# Patient Record
Sex: Male | Born: 1985 | Race: White | Hispanic: No | Marital: Single | Smoking: Never smoker
Health system: Southern US, Community
[De-identification: ages and names within clinical notes are randomized; demographics above are authoritative.]

## PROBLEM LIST (undated history)

## (undated) DIAGNOSIS — G40B09 Juvenile myoclonic epilepsy, not intractable, without status epilepticus: Secondary | ICD-10-CM

## (undated) DIAGNOSIS — R569 Unspecified convulsions: Secondary | ICD-10-CM

## (undated) HISTORY — PX: OTHER SURGICAL HISTORY: SHX169

## (undated) HISTORY — PX: ELBOW SURGERY: SHX618

## (undated) HISTORY — PX: ABDOMINAL SURGERY: SHX537

## (undated) HISTORY — PX: FRACTURE SURGERY: SHX138

## (undated) HISTORY — PX: SHOULDER SURGERY: SHX246

---
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
## 2006-05-04 ENCOUNTER — Emergency Department (HOSPITAL_COMMUNITY): Payer: Self-pay | Admitting: EXTERNAL

## 2006-07-14 ENCOUNTER — Ambulatory Visit (INDEPENDENT_AMBULATORY_CARE_PROVIDER_SITE_OTHER): Payer: Self-pay | Admitting: PEDIATRIC MEDICINE

## 2006-11-07 ENCOUNTER — Encounter (FREE_STANDING_LABORATORY_FACILITY)
Admit: 2006-11-07 | Discharge: 2006-11-07 | Disposition: A | Discharge status: Home / Self Care | Payer: Self-pay | Attending: Psychiatry | Admitting: Psychiatry

## 2006-11-07 ENCOUNTER — Ambulatory Visit (INDEPENDENT_AMBULATORY_CARE_PROVIDER_SITE_OTHER): Payer: PRIVATE HEALTH INSURANCE | Admitting: Psychiatry

## 2006-11-07 DIAGNOSIS — F39 Unspecified mood [affective] disorder: Secondary | ICD-10-CM | POA: Diagnosis present

## 2006-12-05 ENCOUNTER — Encounter (INDEPENDENT_AMBULATORY_CARE_PROVIDER_SITE_OTHER): Payer: PRIVATE HEALTH INSURANCE

## 2006-12-08 ENCOUNTER — Encounter (HOSPITAL_COMMUNITY): Payer: Self-pay | Admitting: Psychiatry

## 2008-04-02 ENCOUNTER — Encounter (EMERGENCY_DEPARTMENT_HOSPITAL): Payer: Medicaid Other | Admitting: Trauma Surgery

## 2008-04-02 ENCOUNTER — Emergency Department (EMERGENCY_DEPARTMENT_HOSPITAL): Payer: Medicaid Other

## 2008-04-02 ENCOUNTER — Encounter (HOSPITAL_COMMUNITY): Payer: Self-pay

## 2008-04-02 ENCOUNTER — Observation Stay
Admission: EM | Admit: 2008-04-02 | Discharge: 2008-04-03 | Disposition: A | Discharge status: Home / Self Care | Payer: Medicaid Other | Source: Emergency Department | Attending: Trauma Surgery | Admitting: Trauma Surgery

## 2008-04-02 DIAGNOSIS — Z043 Encounter for examination and observation following other accident: Principal | ICD-10-CM | POA: Insufficient documentation

## 2008-04-02 DIAGNOSIS — G809 Cerebral palsy, unspecified: Secondary | ICD-10-CM | POA: Insufficient documentation

## 2008-04-02 DIAGNOSIS — G40909 Epilepsy, unspecified, not intractable, without status epilepticus: Secondary | ICD-10-CM | POA: Insufficient documentation

## 2008-04-02 DIAGNOSIS — R404 Transient alteration of awareness: Secondary | ICD-10-CM | POA: Insufficient documentation

## 2008-04-02 DIAGNOSIS — IMO0002 Reserved for concepts with insufficient information to code with codable children: Secondary | ICD-10-CM | POA: Insufficient documentation

## 2008-04-02 LAB — PTT (PARTIAL THROMBOPLASTIN TIME): APTT: 24.4 s (ref 22.5–32.0)

## 2008-04-02 LAB — PT/INR
INR: 1 (ref 0.8–1.2)
PROTHROMBIN TIME: 10.3 s (ref 9.1–11.2)

## 2008-04-02 LAB — TYPE AND SCREEN
ABO/RH(D): A POS
ANTIBODY SCREEN: NEGATIVE

## 2008-04-02 LAB — CBC/DIFF
BASOPHILS: 0 % (ref 0–1)
BASOS ABS: 0.013 THOU/uL (ref 0.0–0.2)
EOS ABS: 0.028 THOU/uL — ABNORMAL LOW (ref 0.1–0.3)
EOSINOPHIL: 0 % — ABNORMAL LOW (ref 1–6)
HCT: 46.6 % (ref 39.8–50.2)
HGB: 15.3 g/dL (ref 13.1–17.3)
LYMPHOCYTES: 10 % — ABNORMAL LOW (ref 30–40)
LYMPHS ABS: 1.01 THOU/uL — ABNORMAL LOW (ref 1.2–5.2)
MCH: 29.8 pg (ref 27.4–33.0)
MCHC: 32.8 g/dL (ref 31.6–35.5)
MCV: 90.7 fL (ref 82.0–99.0)
MONOCYTES: 7 % (ref 3–7)
MONOS ABS: 0.767 THOU/uL — ABNORMAL HIGH (ref 0.2–0.6)
MPV: 8.2 FL (ref 7.4–10.4)
PLATELET COUNT: 273 THO/UL (ref 140–450)
PMN ABS: 8.78 10*3/uL — ABNORMAL HIGH (ref 1.8–8.0)
PMN'S: 83 % — ABNORMAL HIGH (ref 52–62)
RBC: 5.13 MIL/uL (ref 4.46–5.70)
RDW: 11.7 % (ref 10.2–14.0)
WBC: 10.6 THOU/UL (ref 3.5–11.0)

## 2008-04-02 LAB — BASIC METABOLIC PANEL: BUN/CREAT RATIO: 13 (ref 6–22)

## 2008-04-02 MED ORDER — MORPHINE 2 MG/ML INJECTION SYRINGE
1.00 mg | INJECTION | INTRAMUSCULAR | Status: DC | PRN
Start: 2008-04-02 — End: 2008-04-02
  Administered 2008-04-02: 2 mg via INTRAVENOUS
  Filled 2008-04-02: qty 1

## 2008-04-02 MED ORDER — SENNOSIDES 8.6 MG-DOCUSATE SODIUM 50 MG TABLET
1.00 | ORAL_TABLET | Freq: Two times a day (BID) | ORAL | Status: DC
Start: 2008-04-02 — End: 2008-04-03
  Administered 2008-04-02 – 2008-04-03 (×2): 1 via ORAL
  Filled 2008-04-02 (×3): qty 1

## 2008-04-02 MED ORDER — ACETAMINOPHEN 325 MG TABLET
650.00 mg | ORAL_TABLET | Freq: Four times a day (QID) | ORAL | Status: DC | PRN
Start: 2008-04-02 — End: 2008-04-03

## 2008-04-02 MED ORDER — SODIUM CHLORIDE 0.9 % INTRAVENOUS SOLUTION
INTRAVENOUS | Status: DC
Start: 2008-04-02 — End: 2008-04-02

## 2008-04-02 MED ORDER — DOCUSATE SODIUM 100 MG CAPSULE
100.00 mg | ORAL_CAPSULE | Freq: Two times a day (BID) | ORAL | Status: DC
Start: 2008-04-02 — End: 2008-04-03
  Administered 2008-04-02 – 2008-04-03 (×2): 100 mg via ORAL
  Filled 2008-04-02 (×3): qty 1

## 2008-04-02 MED ORDER — MORPHINE 4 MG/ML INJECTION SYRINGE
4.0000 mg | INJECTION | Freq: Once | INTRAMUSCULAR | Status: DC
Start: 2008-04-02 — End: 2008-04-02
  Administered 2008-04-02: 0 mg via INTRAVENOUS

## 2008-04-02 MED ORDER — ONDANSETRON HCL (PF) 4 MG/2 ML INJECTION SOLUTION
4.00 mg | Freq: Four times a day (QID) | INTRAMUSCULAR | Status: DC | PRN
Start: 2008-04-02 — End: 2008-04-03

## 2008-04-02 MED ORDER — OXYCODONE-ACETAMINOPHEN 5 MG-325 MG TABLET
1.0000 | ORAL_TABLET | ORAL | Status: DC | PRN
Start: 2008-04-02 — End: 2008-04-03
  Administered 2008-04-03 (×2): 1 via ORAL
  Filled 2008-04-02 (×2): qty 1
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
## 2008-04-02 NOTE — ED Nurses Note (Signed)
Nursing student Corrie Dandy, cleaned right [facility] with chliraopep and inserted 18 gauge angio cath with positive blood return and labs were collected.

## 2008-04-02 NOTE — ED Nurses Note (Signed)
 Dr. Juanita of Trauma in evaluating pt at this time.
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
## 2008-04-02 NOTE — ED Nurses Note (Signed)
Pt resting quietly in room no distress family at bedsdie.  Awaiting disposition
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
## 2008-04-02 NOTE — ED Nurses Note (Signed)
Report called to Darel Hong on 7 East, pt is ready for transport to the floor on a monitor with a nurse.
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
## 2008-04-03 ENCOUNTER — Encounter (HOSPITAL_COMMUNITY): Payer: Self-pay

## 2008-04-03 LAB — CBC/DIFF
BASOPHILS: 0 % (ref 0–1)
BASOS ABS: 0.008 THOU/uL (ref 0.0–0.2)
EOS ABS: 0.094 THOU/uL — ABNORMAL LOW (ref 0.1–0.3)
EOSINOPHIL: 1 % (ref 1–6)
HCT: 44.4 % (ref 39.8–50.2)
HGB: 14.4 g/dL (ref 13.1–17.3)
LYMPHOCYTES: 28 % — ABNORMAL LOW (ref 30–40)
LYMPHS ABS: 2.14 THOU/uL (ref 1.2–5.2)
MCH: 29.9 pg (ref 27.4–33.0)
MCHC: 32.5 g/dL (ref 31.6–35.5)
MCV: 92 fL (ref 82.0–99.0)
MONOCYTES: 11 % — ABNORMAL HIGH (ref 3–7)
MONOS ABS: 0.877 THOU/uL — ABNORMAL HIGH (ref 0.2–0.6)
MPV: 8.8 FL (ref 7.4–10.4)
PLATELET COUNT: 236 THO/UL (ref 140–450)
PMN ABS: 4.6 THOU/uL (ref 1.8–8.0)
PMN'S: 60 % (ref 52–62)
RBC: 4.83 MIL/uL (ref 4.46–5.70)
RDW: 11.7 % (ref 10.2–14.0)
WBC: 7.7 THOU/UL (ref 3.5–11.0)

## 2008-04-03 LAB — CREATININE
CREATININE: 0.99 mg/dL (ref 0.62–1.27)
ESTIMATED GLOMERULAR FILTRATION RATE: >59 mL/min/{1.73_m2} (ref >59)

## 2008-04-03 LAB — BUN
BUN/CREAT RATIO: 11 (ref 6–22)
BUN: 11 mg/dL (ref 8–26)

## 2008-04-03 LAB — H & H
HCT: 44.2 % (ref 39.8–50.2)
HGB: 14.5 g/dL (ref 13.1–17.3)

## 2008-04-03 LAB — ELECTROLYTES
ANION GAP: 7 mmol/L (ref 5–16)
CARBON DIOXIDE: 29 mmol/L (ref 23–33)
SODIUM: 142 mmol/L (ref 136–145)

## 2008-04-03 MED ORDER — OXYCODONE-ACETAMINOPHEN 5 MG-325 MG TABLET
1.00 | ORAL_TABLET | ORAL | Status: DC | PRN
Start: 2008-04-03 — End: 2020-05-31

## 2008-04-03 MED ORDER — DOCUSATE SODIUM 100 MG CAPSULE
100.00 mg | ORAL_CAPSULE | Freq: Two times a day (BID) | ORAL | Status: DC
Start: 2008-04-03 — End: 2020-05-31

## 2008-04-03 NOTE — Nurses Notes (Signed)
Complete assessment as charted per flowsheet.  Neuro. AAOX3.  Resps. Even and non-laboured.  Denies shortness of breath.  Sats. WNL.  VSS.  Pain controlled with PRN analgesics.  Immobilizer intact to LLE.  Dsicharge instructions given along with return appointments.  Father at bedside for transport to home.

## 2008-04-03 NOTE — Nurses Notes (Signed)
C/O pain gave 1 percocet per pain order, will monitor.
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
## 2008-04-17 ENCOUNTER — Ambulatory Visit (INDEPENDENT_AMBULATORY_CARE_PROVIDER_SITE_OTHER): Payer: Medicaid Other

## 2008-04-24 ENCOUNTER — Ambulatory Visit (INDEPENDENT_AMBULATORY_CARE_PROVIDER_SITE_OTHER): Payer: Medicaid Other | Admitting: ORTHOPEDIC, SPORTS MEDICINE
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
## 2010-07-26 ENCOUNTER — Emergency Department (HOSPITAL_BASED_OUTPATIENT_CLINIC_OR_DEPARTMENT_OTHER)
Admission: EM | Admit: 2010-07-26 | Discharge: 2010-07-26 | Payer: Self-pay | Source: Home / Self Care | Admitting: Emergency Medicine

## 2010-09-01 ENCOUNTER — Emergency Department (INDEPENDENT_AMBULATORY_CARE_PROVIDER_SITE_OTHER): Payer: Self-pay

## 2010-09-01 ENCOUNTER — Emergency Department (HOSPITAL_BASED_OUTPATIENT_CLINIC_OR_DEPARTMENT_OTHER)
Admission: EM | Admit: 2010-09-01 | Discharge: 2010-09-01 | Disposition: A | Discharge status: Home / Self Care | Payer: Self-pay | Attending: Emergency Medicine | Admitting: Emergency Medicine

## 2010-09-01 DIAGNOSIS — R071 Chest pain on breathing: Secondary | ICD-10-CM | POA: Insufficient documentation

## 2010-09-01 DIAGNOSIS — G40909 Epilepsy, unspecified, not intractable, without status epilepticus: Secondary | ICD-10-CM | POA: Insufficient documentation

## 2010-09-01 DIAGNOSIS — R072 Precordial pain: Secondary | ICD-10-CM

## 2010-09-22 ENCOUNTER — Inpatient Hospital Stay (HOSPITAL_COMMUNITY)
Admission: EM | Admit: 2010-09-22 | Discharge: 2010-09-24 | DRG: 206 | Disposition: A | Discharge status: Home / Self Care | Payer: No Typology Code available for payment source | Attending: Surgery | Admitting: Surgery

## 2010-09-22 ENCOUNTER — Emergency Department (HOSPITAL_COMMUNITY): Payer: Self-pay

## 2010-09-22 ENCOUNTER — Emergency Department (HOSPITAL_COMMUNITY): Payer: No Typology Code available for payment source

## 2010-09-22 DIAGNOSIS — E860 Dehydration: Secondary | ICD-10-CM | POA: Diagnosis present

## 2010-09-22 DIAGNOSIS — G40909 Epilepsy, unspecified, not intractable, without status epilepticus: Secondary | ICD-10-CM | POA: Diagnosis present

## 2010-09-22 DIAGNOSIS — S27329A Contusion of lung, unspecified, initial encounter: Principal | ICD-10-CM | POA: Diagnosis present

## 2010-09-22 LAB — CBC
HCT: 47.6 % (ref 39.0–52.0)
Hemoglobin: 16.9 g/dL (ref 13.0–17.0)
MCH: 31.8 pg (ref 26.0–34.0)
MCHC: 35.5 g/dL (ref 30.0–36.0)
MCV: 89.5 fL (ref 78.0–100.0)
RDW: 12.6 % (ref 11.5–15.5)

## 2010-09-22 LAB — COMPREHENSIVE METABOLIC PANEL
Albumin: 4.2 g/dL (ref 3.5–5.2)
Alkaline Phosphatase: 44 U/L (ref 39–117)
BUN: 16 mg/dL (ref 6–23)
CO2: 28 mEq/L (ref 19–32)
Chloride: 106 mEq/L (ref 96–112)
Creatinine, Ser: 1.19 mg/dL (ref 0.4–1.5)
GFR calc non Af Amer: >60 mL/min (ref >60)
Potassium: 4.2 mEq/L (ref 3.5–5.1)
Total Bilirubin: 0.9 mg/dL (ref 0.3–1.2)

## 2010-09-22 LAB — POCT I-STAT, CHEM 8
BUN: 21 mg/dL (ref 6–23)
Calcium, Ion: 1.21 mmol/L (ref 1.12–1.32)
Chloride: 104 mEq/L (ref 96–112)
Creatinine, Ser: 1.2 mg/dL (ref 0.4–1.5)
Glucose, Bld: 149 mg/dL — ABNORMAL HIGH (ref 70–99)
Potassium: 4 mEq/L (ref 3.5–5.1)

## 2010-09-22 LAB — PROTIME-INR: INR: 0.98 (ref 0.00–1.49)

## 2010-09-22 MED ORDER — IOHEXOL 300 MG/ML  SOLN: 100.0000 mL | Freq: Once | INTRAMUSCULAR | Status: DC | PRN

## 2010-09-22 MED ORDER — IOHEXOL 300 MG/ML  SOLN
100.0000 mL | Freq: Once | INTRAMUSCULAR | Status: AC | PRN
  Administered 2010-09-22: 100 mL via INTRAVENOUS

## 2010-09-23 ENCOUNTER — Inpatient Hospital Stay (HOSPITAL_COMMUNITY): Payer: Self-pay
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
## 2010-10-05 ENCOUNTER — Emergency Department (INDEPENDENT_AMBULATORY_CARE_PROVIDER_SITE_OTHER): Payer: No Typology Code available for payment source

## 2010-10-05 ENCOUNTER — Emergency Department (HOSPITAL_BASED_OUTPATIENT_CLINIC_OR_DEPARTMENT_OTHER)
Admission: EM | Admit: 2010-10-05 | Discharge: 2010-10-05 | Disposition: A | Discharge status: Home / Self Care | Payer: No Typology Code available for payment source | Attending: Emergency Medicine | Admitting: Emergency Medicine

## 2010-10-05 DIAGNOSIS — R51 Headache: Secondary | ICD-10-CM | POA: Insufficient documentation

## 2010-10-05 DIAGNOSIS — F29 Unspecified psychosis not due to a substance or known physiological condition: Secondary | ICD-10-CM | POA: Insufficient documentation

## 2010-10-05 DIAGNOSIS — R413 Other amnesia: Secondary | ICD-10-CM

## 2010-10-05 LAB — COMPREHENSIVE METABOLIC PANEL
ALT: 21 U/L (ref 0–53)
AST: 35 U/L (ref 0–37)
Albumin: 4.2 g/dL (ref 3.5–5.2)
Calcium: 9.6 mg/dL (ref 8.4–10.5)
Creatinine, Ser: 1.3 mg/dL (ref 0.4–1.5)
GFR calc Af Amer: >60 mL/min (ref >60)
Sodium: 145 mEq/L (ref 135–145)
Total Protein: 7.2 g/dL (ref 6.0–8.3)

## 2010-10-05 LAB — URINALYSIS, ROUTINE W REFLEX MICROSCOPIC
Hgb urine dipstick: NEGATIVE
Ketones, ur: 15 mg/dL — AB
Protein, ur: 30 mg/dL — AB
Specific Gravity, Urine: 1.027 (ref 1.005–1.030)
Urobilinogen, UA: 1 mg/dL (ref 0.0–1.0)

## 2010-10-05 LAB — CBC
MCH: 30.9 pg (ref 26.0–34.0)
MCHC: 34.6 g/dL (ref 30.0–36.0)
Platelets: 222 10*3/uL (ref 150–400)
RBC: 4.82 MIL/uL (ref 4.22–5.81)

## 2010-10-05 LAB — DIFFERENTIAL
Basophils Absolute: 0 10*3/uL (ref 0.0–0.1)
Basophils Relative: 0 % (ref 0–1)
Eosinophils Absolute: 0 10*3/uL (ref 0.0–0.7)
Monocytes Absolute: 0.7 10*3/uL (ref 0.1–1.0)
Monocytes Relative: 11 % (ref 3–12)
Neutro Abs: 4.1 10*3/uL (ref 1.7–7.7)
Neutrophils Relative %: 64 % (ref 43–77)

## 2010-10-05 LAB — POCT TOXICOLOGY PANEL

## 2010-10-05 LAB — URINE MICROSCOPIC-ADD ON

## 2010-10-25 ENCOUNTER — Inpatient Hospital Stay (INDEPENDENT_AMBULATORY_CARE_PROVIDER_SITE_OTHER)
Admission: RE | Admit: 2010-10-25 | Discharge: 2010-10-25 | Disposition: A | Discharge status: Home / Self Care | Payer: Self-pay | Source: Ambulatory Visit | Attending: Family Medicine | Admitting: Family Medicine

## 2010-10-25 DIAGNOSIS — R569 Unspecified convulsions: Secondary | ICD-10-CM

## 2010-10-29 ENCOUNTER — Emergency Department (HOSPITAL_COMMUNITY): Payer: No Typology Code available for payment source

## 2010-10-29 ENCOUNTER — Emergency Department (HOSPITAL_COMMUNITY)
Admission: EM | Admit: 2010-10-29 | Discharge: 2010-10-29 | Disposition: A | Discharge status: Home / Self Care | Payer: No Typology Code available for payment source | Attending: Emergency Medicine | Admitting: Emergency Medicine

## 2010-10-29 DIAGNOSIS — S025XXA Fracture of tooth (traumatic), initial encounter for closed fracture: Secondary | ICD-10-CM | POA: Insufficient documentation

## 2010-10-29 DIAGNOSIS — S0180XA Unspecified open wound of other part of head, initial encounter: Secondary | ICD-10-CM | POA: Insufficient documentation

## 2010-10-29 DIAGNOSIS — Y9289 Other specified places as the place of occurrence of the external cause: Secondary | ICD-10-CM | POA: Insufficient documentation

## 2010-10-29 DIAGNOSIS — F29 Unspecified psychosis not due to a substance or known physiological condition: Secondary | ICD-10-CM | POA: Insufficient documentation

## 2010-10-29 DIAGNOSIS — R296 Repeated falls: Secondary | ICD-10-CM | POA: Insufficient documentation

## 2010-10-29 DIAGNOSIS — R6884 Jaw pain: Secondary | ICD-10-CM | POA: Insufficient documentation

## 2010-10-29 DIAGNOSIS — G40909 Epilepsy, unspecified, not intractable, without status epilepticus: Secondary | ICD-10-CM | POA: Insufficient documentation

## 2010-10-29 DIAGNOSIS — R51 Headache: Secondary | ICD-10-CM | POA: Insufficient documentation

## 2010-10-29 DIAGNOSIS — M542 Cervicalgia: Secondary | ICD-10-CM | POA: Insufficient documentation

## 2010-10-29 LAB — POCT I-STAT, CHEM 8
Calcium, Ion: 1.18 mmol/L (ref 1.12–1.32)
Glucose, Bld: 121 mg/dL — ABNORMAL HIGH (ref 70–99)
HCT: 49 % (ref 39.0–52.0)
Hemoglobin: 16.7 g/dL (ref 13.0–17.0)

## 2011-04-27 ENCOUNTER — Emergency Department (INDEPENDENT_AMBULATORY_CARE_PROVIDER_SITE_OTHER): Payer: No Typology Code available for payment source

## 2011-04-27 ENCOUNTER — Encounter: Payer: Self-pay | Admitting: Family Medicine

## 2011-04-27 ENCOUNTER — Emergency Department (HOSPITAL_BASED_OUTPATIENT_CLINIC_OR_DEPARTMENT_OTHER)
Admission: EM | Admit: 2011-04-27 | Discharge: 2011-04-27 | Disposition: A | Discharge status: Home / Self Care | Payer: Self-pay | Attending: Emergency Medicine | Admitting: Emergency Medicine

## 2011-04-27 DIAGNOSIS — S0083XA Contusion of other part of head, initial encounter: Secondary | ICD-10-CM | POA: Insufficient documentation

## 2011-04-27 DIAGNOSIS — S0003XA Contusion of scalp, initial encounter: Secondary | ICD-10-CM | POA: Insufficient documentation

## 2011-04-27 DIAGNOSIS — Y92009 Unspecified place in unspecified non-institutional (private) residence as the place of occurrence of the external cause: Secondary | ICD-10-CM | POA: Insufficient documentation

## 2011-04-27 DIAGNOSIS — R51 Headache: Secondary | ICD-10-CM

## 2011-04-27 DIAGNOSIS — R5383 Other fatigue: Secondary | ICD-10-CM

## 2011-04-27 DIAGNOSIS — G40909 Epilepsy, unspecified, not intractable, without status epilepticus: Secondary | ICD-10-CM | POA: Insufficient documentation

## 2011-04-27 DIAGNOSIS — W19XXXA Unspecified fall, initial encounter: Secondary | ICD-10-CM | POA: Insufficient documentation

## 2011-04-27 DIAGNOSIS — IMO0002 Reserved for concepts with insufficient information to code with codable children: Secondary | ICD-10-CM

## 2011-04-27 DIAGNOSIS — S0100XA Unspecified open wound of scalp, initial encounter: Secondary | ICD-10-CM

## 2011-04-27 HISTORY — DX: Unspecified convulsions: R56.9

## 2011-04-27 MED ORDER — LORAZEPAM 2 MG/ML IJ SOLN
INTRAMUSCULAR | Status: AC
  Administered 2011-04-27: 1 mg via INTRAVENOUS
  Filled 2011-04-27: qty 1

## 2011-04-27 MED ORDER — LORAZEPAM 2 MG/ML IJ SOLN
1.0000 mg | Freq: Once | INTRAMUSCULAR | Status: AC
  Administered 2011-04-27: 1 mg via INTRAVENOUS

## 2011-04-27 NOTE — ED Provider Notes (Signed)
History     CSN: [id number] Arrival date & time: 04/27/2011  9:04 AM   First MD Initiated Contact with Patient 04/27/11 [phone]      Chief Complaint  Patient presents with  . Seizures    (Consider location/radiation/quality/duration/timing/severity/associated sxs/prior treatment) Patient is a 25 y.o. male presenting with seizures.  Seizures  This is a chronic problem. The current episode started less than 1 hour ago. The problem has been resolved. There was 1 seizure. The most recent episode lasted less than 30 seconds. Associated symptoms include sleepiness, headaches and vomiting. Pertinent negatives include no visual disturbance. Characteristics include rhythmic jerking and loss of consciousness. Characteristics do not include bowel incontinence, bladder incontinence or bit tongue. The episode was witnessed. There was no sensation of an aura present. The seizures did not continue in the ED. The seizure(s) had no focality. Possible causes do not include med or dosage change, sleep deprivation, missed seizure meds, recent illness or change in alcohol use. There has been no fever. There were no medications administered prior to arrival.   Pt hit his head on a tile floor when fell.  No neck pain and only mild head pain  Past Medical History  Diagnosis Date  . Seizure     No past surgical history on file.  No family history on file.  History  Substance Use Topics  . Smoking status: Not on file  . Smokeless tobacco: Not on file  . Alcohol Use:       Review of Systems  HENT: Negative for nosebleeds, facial swelling, neck pain and neck stiffness.   Eyes: Negative.  Negative for photophobia and visual disturbance.  Gastrointestinal: Positive for vomiting. Negative for bowel incontinence.  Genitourinary: Negative for bladder incontinence.  Neurological: Positive for seizures, loss of consciousness and headaches.  All other systems reviewed and are negative.    Allergies    Review of patient's allergies indicates no known allergies.  Home Medications   Current Outpatient Rx  Name Route Sig Dispense Refill  . LAMOTRIGINE 100 MG PO TABS Oral Take 100 mg by mouth daily.        BP 132/111  Pulse 95  Resp 16  SpO2 95%  Physical Exam  Nursing note and vitals reviewed. Constitutional: He is oriented to person, place, and time. He appears well-developed and well-nourished. No distress.  HENT:  Head: Normocephalic.    Right Ear: External ear normal.  Left Ear: External ear normal.  Nose: Nose normal.  Mouth/Throat: Oropharynx is clear and moist.  Eyes: Conjunctivae and EOM are normal. Pupils are equal, round, and reactive to light.  Neck: Normal range of motion. Neck supple.  Cardiovascular: Normal rate, regular rhythm, normal heart sounds and intact distal pulses.   Pulmonary/Chest: Effort normal and breath sounds normal. No respiratory distress.  Abdominal: Soft. Bowel sounds are normal.  Musculoskeletal: Normal range of motion. He exhibits no tenderness.  Neurological: He is alert and oriented to person, place, and time.  Skin: Skin is warm and dry.  Psychiatric: He has a normal mood and affect. His behavior is normal. Judgment and thought content normal.    ED Course  Procedures (including critical care time)  Labs Reviewed - No data to display No results found.   No diagnosis found.    MDM  Pt with hx of sz dx and on lamictal without missing any doses and last sz was 3 months ago.  Pt hit his head but awake and alert here.  C-spine cleared  and removed from board.  No other injuries other than head.  Will get CT.  Neuro intact and pt thinks may be stress related as nothing else has changed and has taken all his lamictal.  Ativan given to prevent sx. CT wnl.  Abrasion on the scalp does not need further repair.  To f/u with neurology if sx become more frequent.      Gwyneth Sprout, MD 04/27/11 [phone]

## 2011-04-27 NOTE — ED Notes (Signed)
Pt attempting to call for transportation.

## 2011-04-27 NOTE — ED Notes (Addendum)
Pt vomiting upon arrival. Suction used to clear vomitus. MD at bedside and medication ordered. Pt sts last seizure in March. Pt sts "sometimes I don't remember to take my medicine".
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
## 2011-04-27 NOTE — ED Notes (Signed)
Pt requesting tylenol for headache

## 2011-04-27 NOTE — ED Notes (Signed)
Pt returned from CT. Alert and oriented, smiling. nad noted.

## 2011-09-21 ENCOUNTER — Encounter (HOSPITAL_BASED_OUTPATIENT_CLINIC_OR_DEPARTMENT_OTHER): Payer: Self-pay

## 2011-09-21 ENCOUNTER — Emergency Department (HOSPITAL_BASED_OUTPATIENT_CLINIC_OR_DEPARTMENT_OTHER)
Admission: EM | Admit: 2011-09-21 | Discharge: 2011-09-21 | Disposition: A | Discharge status: Home / Self Care | Payer: PRIVATE HEALTH INSURANCE | Attending: Emergency Medicine | Admitting: Emergency Medicine

## 2011-09-21 DIAGNOSIS — G40909 Epilepsy, unspecified, not intractable, without status epilepticus: Secondary | ICD-10-CM | POA: Insufficient documentation

## 2011-09-21 DIAGNOSIS — R569 Unspecified convulsions: Secondary | ICD-10-CM

## 2011-09-21 DIAGNOSIS — Z79899 Other long term (current) drug therapy: Secondary | ICD-10-CM | POA: Insufficient documentation

## 2011-09-21 LAB — DIFFERENTIAL
Basophils Absolute: 0 10*3/uL (ref 0.0–0.1)
Basophils Relative: 0 % (ref 0–1)
Neutro Abs: 8.1 10*3/uL — ABNORMAL HIGH (ref 1.7–7.7)
Neutrophils Relative %: 80 % — ABNORMAL HIGH (ref 43–77)

## 2011-09-21 LAB — CBC
MCHC: 34.9 g/dL (ref 30.0–36.0)
Platelets: 199 10*3/uL (ref 150–400)
RDW: 12.9 % (ref 11.5–15.5)

## 2011-09-21 LAB — BASIC METABOLIC PANEL
Chloride: 106 mEq/L (ref 96–112)
Creatinine, Ser: 1 mg/dL (ref 0.50–1.35)
GFR calc Af Amer: >90 mL/min (ref >90)
Potassium: 4.5 mEq/L (ref 3.5–5.1)
Sodium: 142 mEq/L (ref 135–145)

## 2011-09-21 LAB — URINALYSIS, ROUTINE W REFLEX MICROSCOPIC
Ketones, ur: NEGATIVE mg/dL
Leukocytes, UA: NEGATIVE
Nitrite: NEGATIVE
pH: 7 (ref 5.0–8.0)

## 2011-09-21 MED ORDER — ONDANSETRON HCL 4 MG/2ML IJ SOLN
4.0000 mg | Freq: Once | INTRAMUSCULAR | Status: AC
  Administered 2011-09-21: 4 mg via INTRAVENOUS

## 2011-09-21 MED ORDER — ONDANSETRON HCL 4 MG/2ML IJ SOLN
INTRAMUSCULAR | Status: AC
  Filled 2011-09-21: qty 2

## 2011-09-21 NOTE — ED Provider Notes (Signed)
History     CSN: [id number]  Arrival date & time 09/21/11  1419   First MD Initiated Contact with Patient 09/21/11 1616      Chief Complaint  Patient presents with  . Seizures    (Consider location/radiation/quality/duration/timing/severity/associated sxs/prior treatment) Patient is a 26 y.o. male presenting with seizures. The history is provided by the patient. No language interpreter was used.  Seizures  This is a recurrent problem. The current episode started less than 1 hour ago. The problem has been resolved. There was 1 seizure. Characteristics include rhythmic jerking. The episode was witnessed. There was no sensation of an aura present. The seizures did not continue in the ED. The seizure(s) had no focality. Possible causes include missed seizure meds. There has been no fever.  Pt reports he missed 2 days of lamictal.  Pt reports he took dosage today.  Pt had a seizure before coming in.    Past Medical History  Diagnosis Date  . Seizure     Past Surgical History  Procedure Date  . Pyloric stenosis surgery     No family history on file.  History  Substance Use Topics  . Smoking status: Never Smoker   . Smokeless tobacco: Not on file  . Alcohol Use: Yes     occ      Review of Systems  Neurological: Positive for seizures.  All other systems reviewed and are negative.    Allergies  Review of patient's allergies indicates no known allergies.  Home Medications   Current Outpatient Rx  Name Route Sig Dispense Refill  . IBUPROFEN 200 MG PO TABS Oral Take 400 mg by mouth daily. As needed for pain     . LAMOTRIGINE 100 MG PO TABS Oral Take 250 mg by mouth 2 (two) times daily.       BP 108/59  Pulse 80  Temp(Src) 98.3 F (36.8 C) (Oral)  Resp 18  Ht 6' (1.829 m)  Wt 180 lb (81.647 kg)  BMI 24.41 kg/m2  SpO2 100%  Physical Exam  Nursing note and vitals reviewed. Constitutional: He is oriented to person, place, and time. He appears well-developed and  well-nourished.  HENT:  Head: Normocephalic and atraumatic.  Right Ear: External ear normal.  Left Ear: External ear normal.  Nose: Nose normal.  Mouth/Throat: Oropharynx is clear and moist.  Eyes: Conjunctivae and EOM are normal. Pupils are equal, round, and reactive to light.  Neck: Normal range of motion. Neck supple.  Cardiovascular: Normal rate and normal heart sounds.   Pulmonary/Chest: Effort normal and breath sounds normal.  Abdominal: Soft.  Musculoskeletal: Normal range of motion.  Neurological: He is alert and oriented to person, place, and time.  Skin: Skin is warm.  Psychiatric: He has a normal mood and affect.    ED Course  Procedures (including critical care time)  Labs Reviewed - No data to display No results found.   No diagnosis found.    MDM   Results for orders placed during the hospital encounter of 09/21/11  CBC      Component Value Range   WBC 10.1  4.0 - 10.5 (K/uL)   RBC 5.03  4.22 - 5.81 (MIL/uL)   Hemoglobin 16.0  13.0 - 17.0 (g/dL)   HCT 40.9  81.1 - 91.4 (%)   MCV 91.3  78.0 - 100.0 (fL)   MCH 31.8  26.0 - 34.0 (pg)   MCHC 34.9  30.0 - 36.0 (g/dL)   RDW 78.2  95.6 -  15.5 (%)   Platelets 199  150 - 400 (K/uL)  DIFFERENTIAL      Component Value Range   Neutrophils Relative 80 (*) 43 - 77 (%)   Neutro Abs 8.1 (*) 1.7 - 7.7 (K/uL)   Lymphocytes Relative 11 (*) 12 - 46 (%)   Lymphs Abs 1.1  0.7 - 4.0 (K/uL)   Monocytes Relative 8  3 - 12 (%)   Monocytes Absolute 0.9  0.1 - 1.0 (K/uL)   Eosinophils Relative 0  0 - 5 (%)   Eosinophils Absolute 0.0  0.0 - 0.7 (K/uL)   Basophils Relative 0  0 - 1 (%)   Basophils Absolute 0.0  0.0 - 0.1 (K/uL)  BASIC METABOLIC PANEL      Component Value Range   Sodium 142  135 - 145 (mEq/L)   Potassium 4.5  3.5 - 5.1 (mEq/L)   Chloride 106  96 - 112 (mEq/L)   CO2 29  19 - 32 (mEq/L)   Glucose, Bld 100 (*) 70 - 99 (mg/dL)   BUN 10  6 - 23 (mg/dL)   Creatinine, Ser 2.44  0.50 - 1.35 (mg/dL)   Calcium  9.5  8.4 - 10.5 (mg/dL)   GFR calc non Af Amer >90  >90 (mL/min)   GFR calc Af Amer >90  >90 (mL/min)  URINALYSIS, ROUTINE W REFLEX MICROSCOPIC      Component Value Range   Color, Urine YELLOW  YELLOW    APPearance CLEAR  CLEAR    Specific Gravity, Urine 1.021  1.005 - 1.030    pH 7.0  5.0 - 8.0    Glucose, UA NEGATIVE  NEGATIVE (mg/dL)   Hgb urine dipstick NEGATIVE  NEGATIVE    Bilirubin Urine NEGATIVE  NEGATIVE    Ketones, ur NEGATIVE  NEGATIVE (mg/dL)   Protein, ur NEGATIVE  NEGATIVE (mg/dL)   Urobilinogen, UA 0.2  0.0 - 1.0 (mg/dL)   Nitrite NEGATIVE  NEGATIVE    Leukocytes, UA NEGATIVE  NEGATIVE    No results found.   Pt counseled on need to take his medication.  He is awake alert,  Eating and drinking.        Lonia Skinner [place], [place] 09/21/11 [phone]

## 2011-09-21 NOTE — ED Provider Notes (Signed)
Medical screening examination/treatment/procedure(s) were performed by non-physician practitioner and as supervising physician I was immediately available for consultation/collaboration.  Ethelda Chick, MD 09/21/11 [phone]

## 2011-09-21 NOTE — ED Notes (Signed)
Pt A/O-last recall was looking for his shoes to go to class-approx 110pm-pt states he did not take his pm dose of lamictal last night

## 2011-09-21 NOTE — Discharge Instructions (Signed)
Epilepsy  A seizure (convulsion) is a sudden change in brain function that causes a change in behavior, muscle activity, or ability to remain awake and alert. If a person has recurring seizures, this is called epilepsy.  CAUSES   Epilepsy is a disorder with many possible causes. Anything that disturbs the normal pattern of brain cell activity can lead to seizures. Seizure can be caused from illness to brain damage to abnormal brain development. Epilepsy may develop because of:   An abnormality in brain wiring.   An imbalance of nerve signaling chemicals (neurotransmitters).   Some combination of these factors.  Scientists are learning an increasing amount about genetic causes of seizures.  SYMPTOMS   The symptoms of a seizure can vary greatly from one person to another. These may include:   An aura, or warning that tells a person they are about to have a seizure.   Abnormal sensations, such as abnormal smell or seeing flashing lights.   Sudden, general body stiffness.   Rhythmic jerking of the face, arm, or leg - on one or both sides.   Sudden change in consciousness.   The person may appear to be awake but not responding.   They may appear to be asleep but cannot be awakened.   Grimacing, chewing, lip smacking, or drooling.   Often there is a period of sleepiness after a seizure.  DIAGNOSIS   The description you give to your caregiver about what you experienced will help them understand your problems. Equally important is the description by any witnesses to your seizure. A physical exam, including a detailed neurological exam, is necessary. An EEG (electroencephalogram) is a painless test of your brain waves. In this test a diagram is created of your brain waves. These diagrams can be interpreted by a specialist. Pictures of your brain are usually taken with:   An MRI.   A CT scan.  Lab tests may be done to look for:   Signs of infection.   Abnormal blood chemistry.  PREVENTION   There is no way to  prevent the development of epilepsy. If you have seizures that are typically triggered by an event (such as flashing lights), try to avoid the trigger. This can help you avoid a seizure.   PROGNOSIS   Most people with epilepsy lead outwardly normal lives. While epilepsy cannot currently be cured, for some people it does eventually go away. Most seizures do not cause brain damage. It is not uncommon for people with epilepsy, especially children, to develop behavioral and emotional problems. These problems are sometimes the consequence of medicine for seizures or social stress. For some people with epilepsy, the risk of seizures restricts their independence and recreational activities. For example, some states refuse drivers licenses to people with epilepsy.  Most women with epilepsy can become pregnant. They should discuss their epilepsy and the medicine they are taking with their caregivers. Women with epilepsy have a 90 percent or better chance of having a normal, healthy baby.  RISKS AND COMPLICATIONS   People with epilepsy are at increased risk of falls, accidents, and injuries. People with epilepsy are at special risk for two life-threatening conditions. These are status epilepticus and sudden unexplained death (extremely rare). Status epilepticus is a long lasting, continuous seizure that is a medical emergency.  TREATMENT   Once epilepsy is diagnosed, it is important to begin treatment as soon as possible. For about 80 percent of those diagnosed with epilepsy, seizures can be controlled with modern medicines   and surgical techniques. Some antiepileptic drugs can interfere with the effectiveness of oral contraceptives. In 1997, the FDA approved a pacemaker for the brain the (vagus nerve stimulator). This stimulator can be used for people with seizures that are not well-controlled by medicine. Studies have shown that in some cases, children may experience fewer seizures if they maintain a strict diet. The strict  diet is called the ketogenic diet. This diet is rich in fats and low in carbohydrates.  HOME CARE INSTRUCTIONS    Your caregiver will make recommendations about driving and safety in normal activities. Follow these carefully.   Take any medicine prescribed exactly as directed.   Do any blood tests requested to monitor the levels of your medicine.   The people you live and work with should know that you are prone to seizures. They should receive instructions on how to help you. In general, a witness to a seizure should:   Cushion your head and body.   Turn you on your side.   Avoid unnecessarily restraining you.   Not place anything inside your mouth.   Call for local emergency medical help if there is any question about what has occurred.   Keep a seizure diary. Record what you recall about any seizure, especially any possible trigger.   If your caregiver has given you a follow-up appointment, it is very important to keep that appointment. Not keeping the appointment could result in permanent injury and disability. If there is any problem keeping the appointment, you must call back to this facility for assistance.  SEEK MEDICAL CARE IF:    You develop signs of infection or other illness. This might increase the risk of a seizure.   You seem to be having more frequent seizures.   Your seizure pattern is changing.  SEEK IMMEDIATE MEDICAL CARE IF:    A seizure does not stop after a few moments.   A seizure causes any difficulty in breathing.   A seizure results in a very severe headache.   A seizure leaves you with the inability to speak or use a part of your body.  MAKE SURE YOU:    Understand these instructions.   Will watch your condition.   Will get help right away if you are not doing well or get worse.  Document Released: 06/27/2005 Document Revised: 06/16/2011 Document Reviewed: 02/01/2008  [employer] Patient Information 2012 [employer]

## 2011-09-21 NOTE — ED Notes (Signed)
[facility] report-pt found seated in chair, vomited/postical,bit lower left lip-was advised by friend at scene that he arrived 30 min PTA to find pt seizing-zofran 4mg  iv given

## 2011-09-27 IMAGING — CT CT MAXILLOFACIAL W/O CM
3 of 5 series · 16 of 40 positions shown, 18 images · non-contrast
Comparison: 09/22/2010.

CT HEAD

CLINICAL DATA: Seizures.  Chin laceration.  Facial pain.  Next
soreness.  Neck pain.

CT HEAD WITHOUT CONTRAST
CT MAXILLOFACIAL WITHOUT CONTRAST
CT CERVICAL SPINE WITHOUT CONTRAST
TECHNIQUE: Multidetector CT imaging of the head, cervical spine,
and maxillofacial structures were performed using the standard
protocol without intravenous contrast. Multiplanar CT image
reconstructions of the cervical spine and maxillofacial structures
were also generated.

[Series 9: c_spine 2.0 b31s · axial · 0.23mm/px · z∈[-358,-228]mm · 6 of 104 slices shown]
[im 13/104  bone]
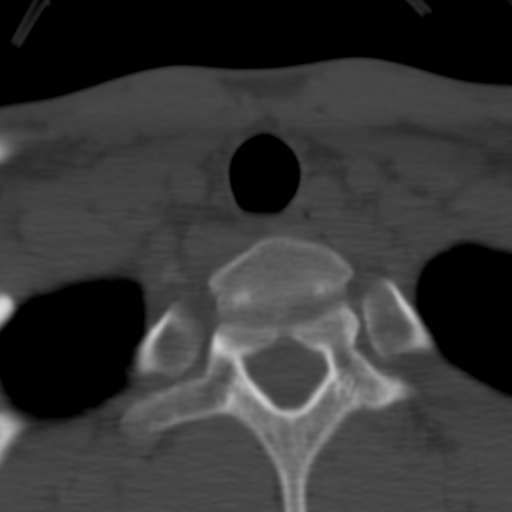
[im 26/104  bone]
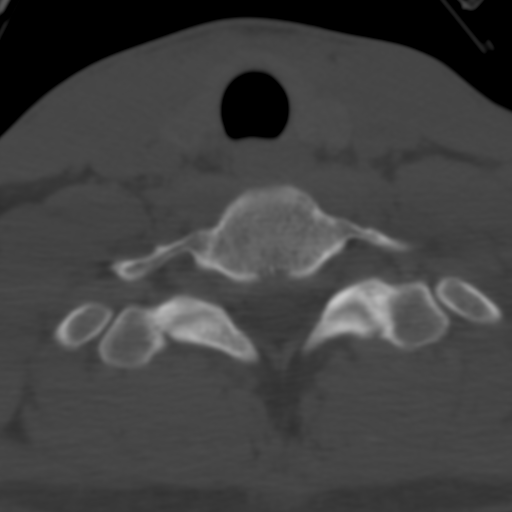
[im 39/104  bone]
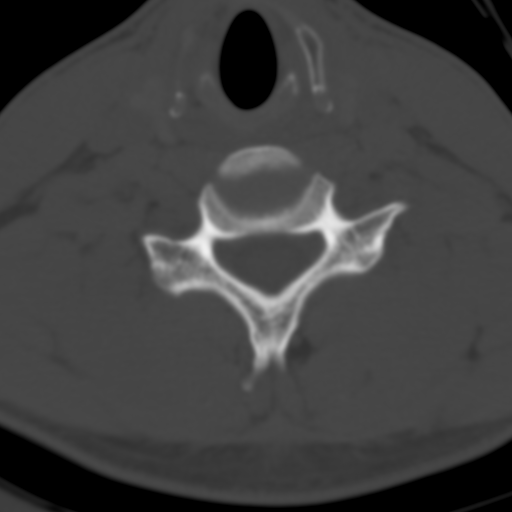
[im 52/104  bone]
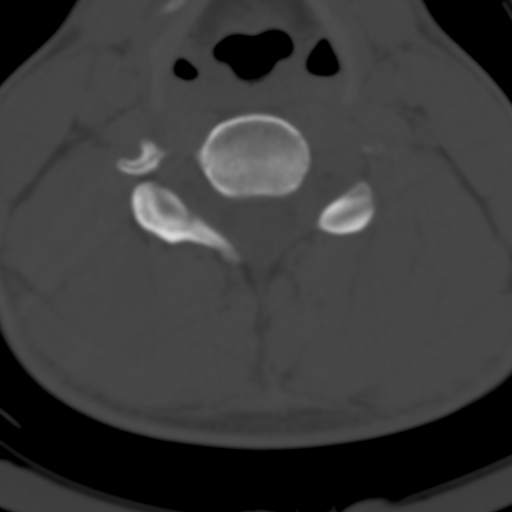
[im 65/104  bone]
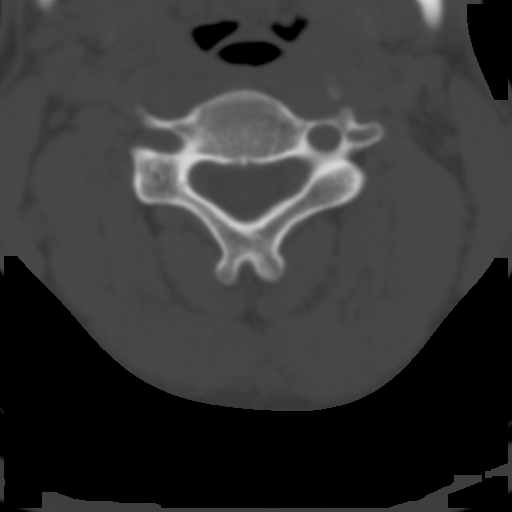
[im 78/104  bone]
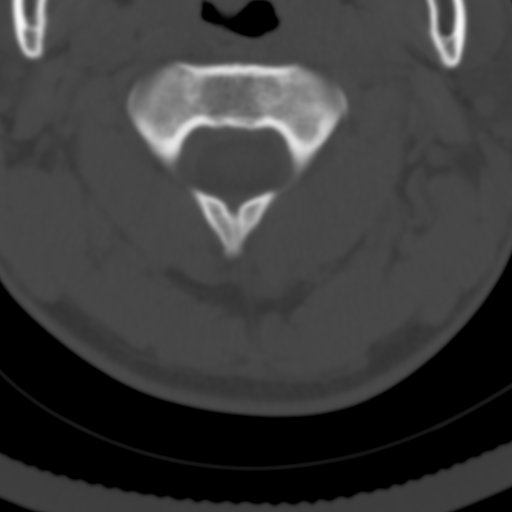

[Series 606: <mpr thick range(4)> · axial · 0.41mm/px · z∈[-384,-232]mm · 7 of 105 slices shown, 9 images]
[im 14/105  brain]
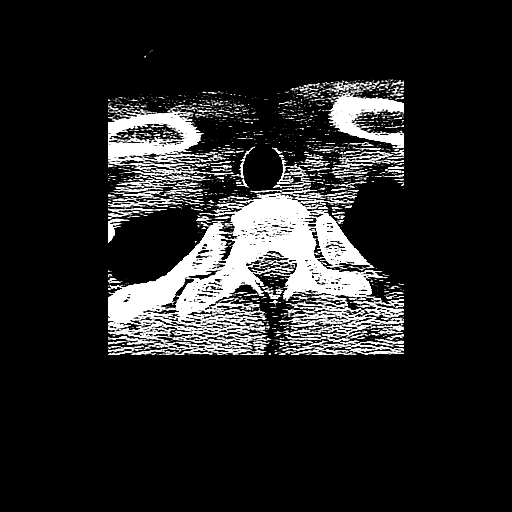
[im 14/105  bone]
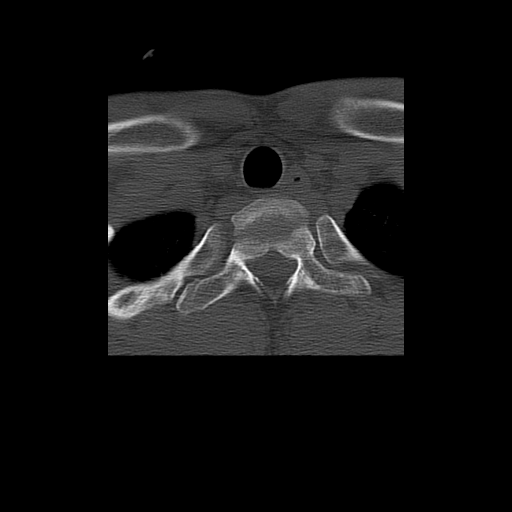
[im 27/105  bone]
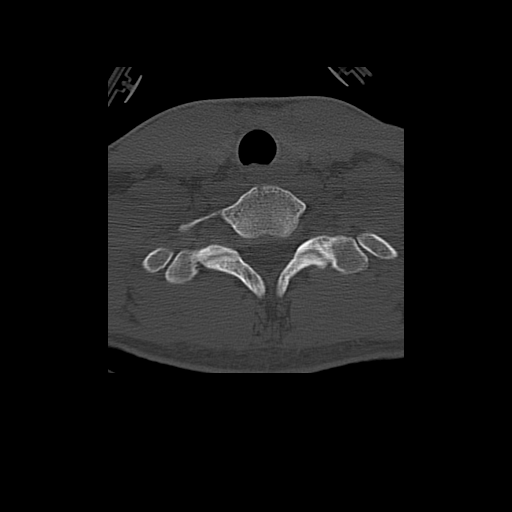
[im 40/105  bone]
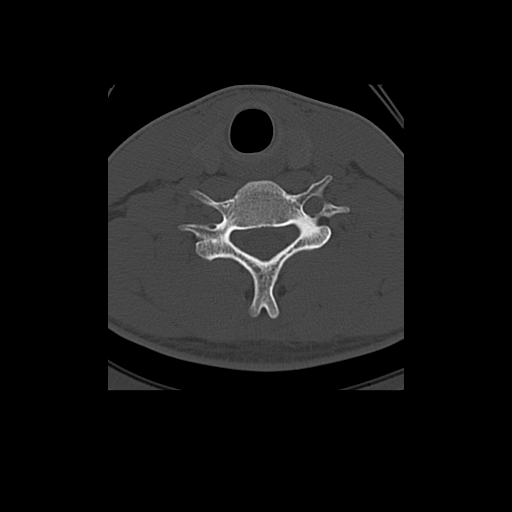
[im 53/105  bone]
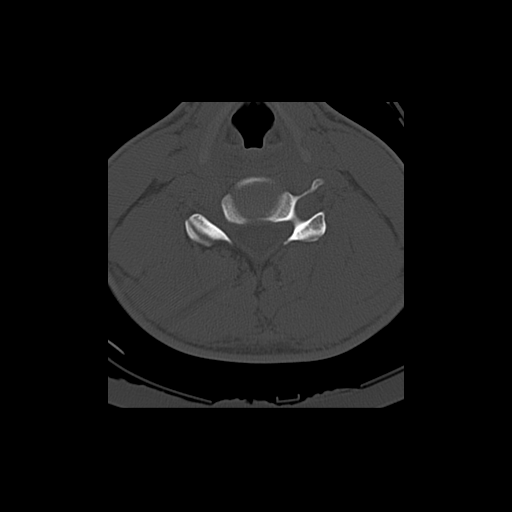
[im 66/105  brain]
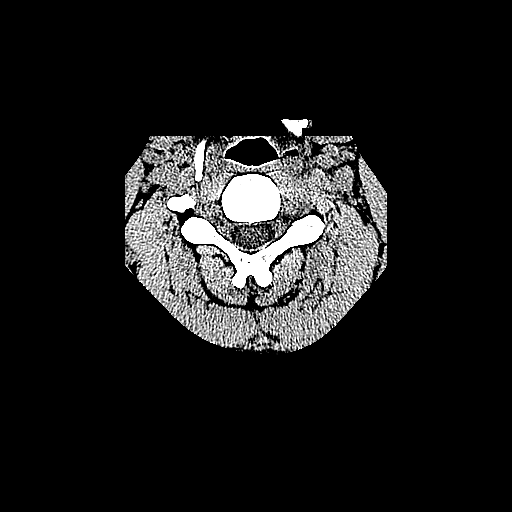
[im 66/105  bone]
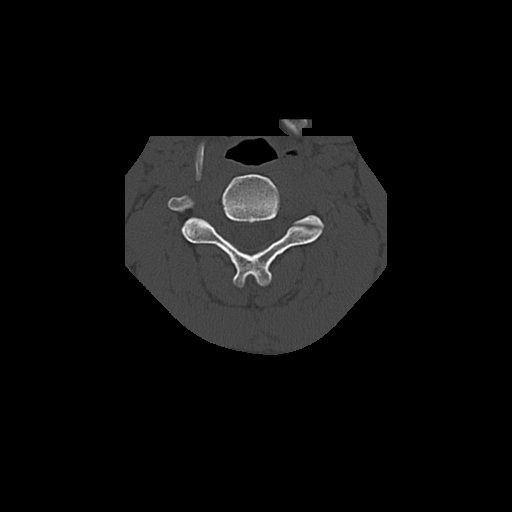
[im 79/105  bone]
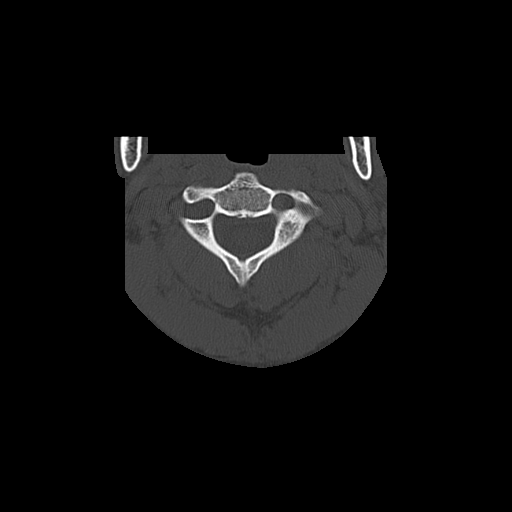
[im 92/105  bone]
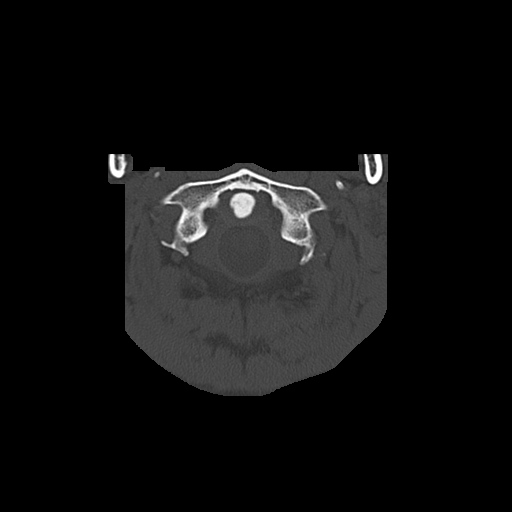

[Series 607: <mpr thick range(5)> · coronal · 0.41mm/px · 3 of 58 slices shown]
[im 20/58  bone]
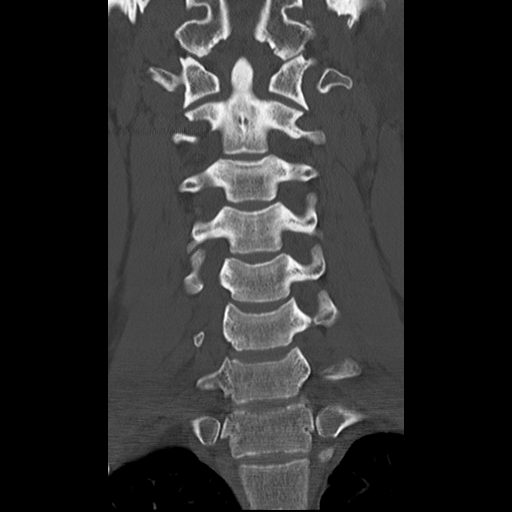
[im 26/58  bone]
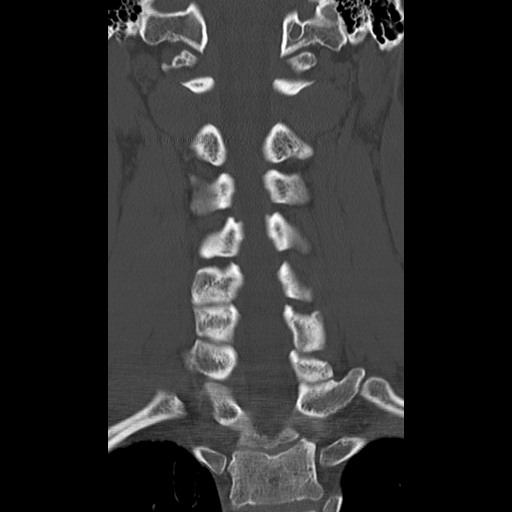
[im 32/58  bone]
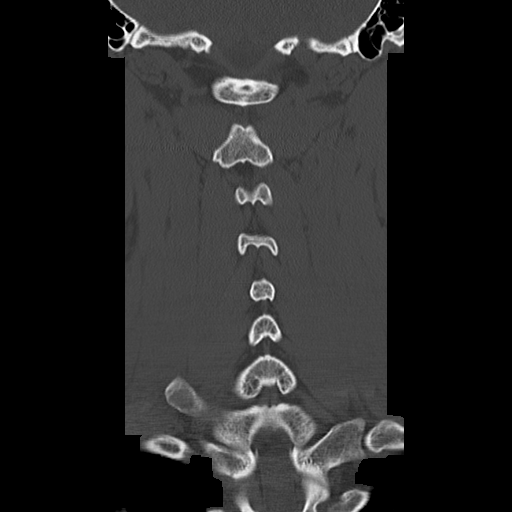

[16 of 40 positions shown; findings below may reference images not displayed]

FINDINGS: No mass lesion, mass effect, midline shift,
hydrocephalus, hemorrhage.  No territorial ischemia or acute
infarction.  Markers present over the right forehead.  Paranasal
sinuses appear within normal limits.  Mastoid air cells clear.
Debris in the right external auditory canal is similar to prior
exam.
IMPRESSION: Negative noncontrast head CT.

CT MAXILLOFACIAL
FINDINGS: Globes and orbits appear intact.  Frontal sinuses clear.
Nasal bones intact.  Maxillary sinuses show a tiny mucous retention
cyst or polyp in the inferior left maxillary sinus.  Mastoid air
cells clear.  Sphenoid sinuses clear.  The pterygoid plates are
intact.  The mandibular condyles are located.  There appears to be
a fracture of the medial aspect of the right maxillary medial
incisor.  Carious dentition is present.

The soft tissues demonstrate a small defect in the soft tissues
over the mandibular symphysis consistent with laceration.
IMPRESSION: No acute facial bone fracture is identified.  Age indeterminant
fracture of the right medial maxillary incisor. Chin laceration.

CT CERVICAL SPINE
FINDINGS: Anatomic alignment.  No cervical spine fracture,
subluxation, or dislocation.  Prevertebral soft tissues are normal.
There is a tiny bone fragment off the right superior T1 transverse
process which is well corticated and appears chronic.  This is
unchanged compared to 09/22/2010.
IMPRESSION: No acute cervical spine fracture, subluxation, or dislocation.  Old
right T1 transverse process avulsion.

## 2011-10-23 ENCOUNTER — Emergency Department (HOSPITAL_COMMUNITY)
Admission: EM | Admit: 2011-10-23 | Discharge: 2011-10-23 | Disposition: A | Discharge status: Hospice | Payer: PRIVATE HEALTH INSURANCE | Attending: Emergency Medicine | Admitting: Emergency Medicine

## 2011-10-23 ENCOUNTER — Encounter (HOSPITAL_COMMUNITY): Payer: Self-pay | Admitting: *Deleted

## 2011-10-23 DIAGNOSIS — R4182 Altered mental status, unspecified: Secondary | ICD-10-CM

## 2011-10-23 LAB — BASIC METABOLIC PANEL
Calcium: 9.5 mg/dL (ref 8.4–10.5)
Chloride: 104 mEq/L (ref 96–112)
Creatinine, Ser: 1.12 mg/dL (ref 0.50–1.35)
GFR calc Af Amer: >90 mL/min (ref >90)

## 2011-10-23 LAB — CBC
HCT: 45.7 % (ref 39.0–52.0)
Platelets: 221 10*3/uL (ref 150–400)
RDW: 12.7 % (ref 11.5–15.5)
WBC: 6.8 10*3/uL (ref 4.0–10.5)

## 2011-10-23 LAB — URINALYSIS, ROUTINE W REFLEX MICROSCOPIC
Leukocytes, UA: NEGATIVE
Nitrite: NEGATIVE
Specific Gravity, Urine: 1.032 — ABNORMAL HIGH (ref 1.005–1.030)
pH: 6.5 (ref 5.0–8.0)

## 2011-10-23 LAB — DIFFERENTIAL
Basophils Absolute: 0 10*3/uL (ref 0.0–0.1)
Lymphocytes Relative: 20 % (ref 12–46)
Neutro Abs: 4.8 10*3/uL (ref 1.7–7.7)
Neutrophils Relative %: 70 % (ref 43–77)

## 2011-10-23 LAB — RAPID URINE DRUG SCREEN, HOSP PERFORMED
Benzodiazepines: NOT DETECTED
Cocaine: NOT DETECTED

## 2011-10-23 NOTE — Discharge Instructions (Signed)
  Take all medicine as prescribed. Get plenty of rest, drink a lot of fluids and eat 3 regular meals each day. See your doctor as needed for problems.   Altered Mental Status Altered mental status most often refers to an abnormal change in your responsiveness and awareness. It can affect your speech, thought, mobility, memory, attention span, or alertness. It can range from slight confusion to complete unresponsiveness (coma). Altered mental status can be a sign of a serious underlying medical condition. Rapid evaluation and medical treatment is necessary for patients having an altered mental status. CAUSES   Low blood sugar (hypoglycemia) or diabetes.   Severe loss of body fluids (dehydration) or a body salt (electrolyte) imbalance.   A stroke or other neurologic problem, such as dementia or delirium.   A head injury or tumor.   A drug or alcohol overdose.   Exposure to toxins or poisons.   Depression, anxiety, and stress.   A low oxygen level (hypoxia).   An infection.   Blood loss.   Twitching or shaking (seizure).   Heart problems, such as heart attack or heart rhythm problems (arrhythmias).   A body temperature that is too low or too high (hypothermia or hyperthermia).  DIAGNOSIS  A diagnosis is based on your history, symptoms, physical and neurologic examinations, and diagnostic tests. Diagnostic tests may include:  Measurement of your blood pressure, pulse, breathing, and oxygen levels (vital signs).   Blood tests.   Urine tests.   X-ray exams.   A computerized magnetic scan (magnetic resonance imaging, MRI).   A computerized X-ray scan (computed tomography, CT scan).  TREATMENT  Treatment will depend on the cause. Treatment may include:  Management of an underlying medical or mental health condition.   Critical care or support in the hospital.  HOME CARE INSTRUCTIONS   Only take over-the-counter or prescription medicines for pain, discomfort, or fever as  directed by your caregiver.   Manage underlying conditions as directed by your caregiver.   Eat a healthy, well-balanced diet to maintain strength.   Join a support group or prevention program to cope with the condition or trauma that caused the altered mental status. Ask your caregiver to help choose a program that works for you.   Follow up with your caregiver for further examination, therapy, or testing as directed.  SEEK MEDICAL CARE IF:   You feel unwell or have chills.   You or your family notice a change in your behavior or your alertness.   You have trouble following your caregiver's treatment plan.   You have questions or concerns.  SEEK IMMEDIATE MEDICAL CARE IF:   You have a rapid heartbeat or have chest pain.   You have difficulty breathing.   You have a fever.   You have a headache with a stiff neck.   You cough up blood.   You have blood in your urine or stool.   You have severe agitation or confusion.  MAKE SURE YOU:   Understand these instructions.   Will watch your condition.   Will get help right away if you are not doing well or get worse.  Document Released: 12/15/2009 Document Revised: 06/16/2011 Document Reviewed: 12/15/2009 [facility] Patient Information 2012 [place], [place].

## 2011-10-23 NOTE — ED Notes (Signed)
Per EMS- pt in s/p possible seizure, pt was found unresponsive at work, pt is wearing medical alert necklace to indicate his seizure history, pt withdrawals to painful stimuli, no verbal response, pupils equal and reactive, pt with IV established PTA and on NRB.

## 2011-10-23 NOTE — ED Notes (Signed)
EAV:[id number]> Expected date:<BR> Expected time: 4:42 PM<BR> Means of arrival:Ambulance<BR> Comments:<BR> M61 -- Seizure

## 2011-10-23 NOTE — ED Provider Notes (Signed)
History     CSN: [id number]  Arrival date & time 10/23/11  1645   First MD Initiated Contact with Patient 10/23/11 1730      Chief Complaint  Patient presents with  . Seizures    (Consider location/radiation/quality/duration/timing/severity/associated sxs/prior treatment) HPI Comments: Paul Dorsey is a 26 y.o. Male who presents for evaluation of altered mental status. They when he felt tired and they would not let him take a break. He then went and sat down. Apparently, after that, he was found unresponsive. There is no history that he had a seizure today. He was transferred by EMS, for evaluation. Currently, the emergency department. He is awake, alert, and answering questions. Patient states that he had a panic attack. He has not had a seizure recently. He reports that he is taking his Lamictal. He has not had any recent fever, chills, nausea, vomiting, cough, shortness of breath or chest pain. He reports having stressed due to problems with finances and completing his diploma. He does not have a local physician.  Patient is a 26 y.o. male presenting with seizures. The history is provided by the patient and the EMS personnel.  Seizures     Past Medical History  Diagnosis Date  . Seizure     Past Surgical History  Procedure Date  . Pyloric stenosis surgery     History reviewed. No pertinent family history.  History  Substance Use Topics  . Smoking status: Never Smoker   . Smokeless tobacco: Not on file  . Alcohol Use: Yes     occ      Review of Systems  Neurological: Positive for seizures.  All other systems reviewed and are negative.    Allergies  Biaxin  Home Medications   Current Outpatient Rx  Name Route Sig Dispense Refill  . ACETAMINOPHEN 325 MG PO TABS Oral Take 650 mg by mouth every 6 (six) hours as needed. Patient used this medication for a headache.    . IBUPROFEN 200 MG PO TABS Oral Take 400 mg by mouth daily. As needed for pain     .  LAMOTRIGINE 100 MG PO TABS Oral Take 250 mg by mouth 2 (two) times daily.       BP 118/54  Pulse 74  Temp(Src) 97.8 F (36.6 C) (Axillary)  Resp 16  SpO2 100%  Physical Exam  Nursing note and vitals reviewed. Constitutional: He is oriented to person, place, and time. He appears well-developed and well-nourished.  HENT:  Head: Normocephalic and atraumatic.  Right Ear: External ear normal.  Left Ear: External ear normal.       No tongue abrasion or dental trauma  Eyes: Conjunctivae and EOM are normal. Pupils are equal, round, and reactive to light.  Neck: Normal range of motion and phonation normal. Neck supple.  Cardiovascular: Normal rate, regular rhythm, normal heart sounds and intact distal pulses.   Pulmonary/Chest: Effort normal and breath sounds normal. He exhibits no bony tenderness.  Abdominal: Soft. Normal appearance. There is no tenderness.  Musculoskeletal: Normal range of motion.  Neurological: He is alert and oriented to person, place, and time. He has normal strength. No cranial nerve deficit or sensory deficit. He exhibits normal muscle tone. Coordination normal.       He is somewhat sleepy, but awake and alert  Skin: Skin is warm, dry and intact.  Psychiatric: He has a normal mood and affect. His behavior is normal. Judgment and thought content normal.    ED Course  Procedures (including critical care time)  Initial evaluation is nonspecific altered mental status. Seizure is a possibility, however, he may simply be experiencing stress reaction.     Labs Reviewed  URINALYSIS, ROUTINE W REFLEX MICROSCOPIC - Abnormal; Notable for the following:    Specific Gravity, Urine 1.032 (*)    Ketones, ur TRACE (*)    All other components within normal limits  BASIC METABOLIC PANEL  CBC  DIFFERENTIAL  URINE RAPID DRUG SCREEN (HOSP PERFORMED)  ETHANOL   No results found.   1. Altered mental state       MDM  Altered mental state with improvement in emergency  department. No clear evidence for seizure. Patient stable for discharge. Doubt medication noncompliance, metabolic instability or occult infection.    Plan: Home Medications- usual; Home Treatments- rest, fluids, eat regularly; Recommended follow up- PCP prn        Flint Melter, MD 10/23/11 2028

## 2011-12-18 ENCOUNTER — Emergency Department (HOSPITAL_BASED_OUTPATIENT_CLINIC_OR_DEPARTMENT_OTHER)
Admission: EM | Admit: 2011-12-18 | Discharge: 2011-12-18 | Disposition: A | Discharge status: Home / Self Care | Payer: PRIVATE HEALTH INSURANCE | Attending: Emergency Medicine | Admitting: Emergency Medicine

## 2011-12-18 ENCOUNTER — Encounter (HOSPITAL_BASED_OUTPATIENT_CLINIC_OR_DEPARTMENT_OTHER): Payer: Self-pay | Admitting: *Deleted

## 2011-12-18 DIAGNOSIS — R569 Unspecified convulsions: Secondary | ICD-10-CM | POA: Insufficient documentation

## 2011-12-18 LAB — BASIC METABOLIC PANEL
BUN: 12 mg/dL (ref 6–23)
CO2: 29 mEq/L (ref 19–32)
Chloride: 103 mEq/L (ref 96–112)
Creatinine, Ser: 1.1 mg/dL (ref 0.50–1.35)
Potassium: 4.2 mEq/L (ref 3.5–5.1)

## 2011-12-18 MED ORDER — LEVETIRACETAM 250 MG PO TABS: 250.0000 mg | ORAL_TABLET | Freq: Two times a day (BID) | ORAL | Status: DC

## 2011-12-18 MED ORDER — SODIUM CHLORIDE 0.9 % IV BOLUS (SEPSIS)
1000.0000 mL | Freq: Once | INTRAVENOUS | Status: AC
  Administered 2011-12-18: 1000 mL via INTRAVENOUS

## 2011-12-18 NOTE — ED Notes (Addendum)
Per EMS patient was at church and had seizure. Patient states that he is compliant with medicines. Patient states he remembers singing and was getting ready to sit and passed out

## 2011-12-18 NOTE — ED Notes (Signed)
Patient oriented X4, aware of having a seizure today, able to drink water w/o difficulty

## 2011-12-18 NOTE — Discharge Instructions (Signed)
Epilepsy A seizure (convulsion) is a sudden change in brain function that causes a change in behavior, muscle activity, or ability to remain awake and alert. If a person has recurring seizures, this is called epilepsy. CAUSES  Epilepsy is a disorder with many possible causes. Anything that disturbs the normal pattern of brain cell activity can lead to seizures. Seizure can be caused from illness to brain damage to abnormal brain development. Epilepsy may develop because of:  An abnormality in brain wiring.   An imbalance of nerve signaling chemicals (neurotransmitters).   Some combination of these factors.  Scientists are learning an increasing amount about genetic causes of seizures. SYMPTOMS  The symptoms of a seizure can vary greatly from one person to another. These may include:  An aura, or warning that tells a person they are about to have a seizure.   Abnormal sensations, such as abnormal smell or seeing flashing lights.   Sudden, general body stiffness.   Rhythmic jerking of the face, arm, or leg - on one or both sides.   Sudden change in consciousness.   The person may appear to be awake but not responding.   They may appear to be asleep but cannot be awakened.   Grimacing, chewing, lip smacking, or drooling.   Often there is a period of sleepiness after a seizure.  DIAGNOSIS  The description you give to your caregiver about what you experienced will help them understand your problems. Equally important is the description by any witnesses to your seizure. A physical exam, including a detailed neurological exam, is necessary. An EEG (electroencephalogram) is a painless test of your brain waves. In this test a diagram is created of your brain waves. These diagrams can be interpreted by a specialist. Pictures of your brain are usually taken with:  An MRI.   A CT scan.  Lab tests may be done to look for:  Signs of infection.   Abnormal blood chemistry.  PREVENTION    There is no way to prevent the development of epilepsy. If you have seizures that are typically triggered by an event (such as flashing lights), try to avoid the trigger. This can help you avoid a seizure.  PROGNOSIS  Most people with epilepsy lead outwardly normal lives. While epilepsy cannot currently be cured, for some people it does eventually go away. Most seizures do not cause brain damage. It is not uncommon for people with epilepsy, especially children, to develop behavioral and emotional problems. These problems are sometimes the consequence of medicine for seizures or social stress. For some people with epilepsy, the risk of seizures restricts their independence and recreational activities. For example, some states refuse drivers licenses to people with epilepsy. Most women with epilepsy can become pregnant. They should discuss their epilepsy and the medicine they are taking with their caregivers. Women with epilepsy have a 90 percent or better chance of having a normal, healthy baby. RISKS AND COMPLICATIONS  People with epilepsy are at increased risk of falls, accidents, and injuries. People with epilepsy are at special risk for two life-threatening conditions. These are status epilepticus and sudden unexplained death (extremely rare). Status epilepticus is a long lasting, continuous seizure that is a medical emergency. TREATMENT  Once epilepsy is diagnosed, it is important to begin treatment as soon as possible. For about 80 percent of those diagnosed with epilepsy, seizures can be controlled with modern medicines and surgical techniques. Some antiepileptic drugs can interfere with the effectiveness of oral contraceptives. In 1997, the  FDA approved a pacemaker for the brain the (vagus nerve stimulator). This stimulator can be used for people with seizures that are not well-controlled by medicine. Studies have shown that in some cases, children may experience fewer seizures if they maintain a  strict diet. The strict diet is called the ketogenic diet. This diet is rich in fats and low in carbohydrates. HOME CARE INSTRUCTIONS   Your caregiver will make recommendations about driving and safety in normal activities. Follow these carefully.   Take any medicine prescribed exactly as directed.   Do any blood tests requested to monitor the levels of your medicine.   The people you live and work with should know that you are prone to seizures. They should receive instructions on how to help you. In general, a witness to a seizure should:   Cushion your head and body.   Turn you on your side.   Avoid unnecessarily restraining you.   Not place anything inside your mouth.   Call for local emergency medical help if there is any question about what has occurred.   Keep a seizure diary. Record what you recall about any seizure, especially any possible trigger.   If your caregiver has given you a follow-up appointment, it is very important to keep that appointment. Not keeping the appointment could result in permanent injury and disability. If there is any problem keeping the appointment, you must call back to this facility for assistance.  SEEK MEDICAL CARE IF:   You develop signs of infection or other illness. This might increase the risk of a seizure.   You seem to be having more frequent seizures.   Your seizure pattern is changing.  SEEK IMMEDIATE MEDICAL CARE IF:   A seizure does not stop after a few moments.   A seizure causes any difficulty in breathing.   A seizure results in a very severe headache.   A seizure leaves you with the inability to speak or use a part of your body.  MAKE SURE YOU:   Understand these instructions.   Will watch your condition.   Will get help right away if you are not doing well or get worse.  Document Released: 06/27/2005 Document Revised: 06/16/2011 Document Reviewed: 02/01/2008 [facility] Patient Information 2012 [place], [place].Driving  and Equipment Restrictions Some medical problems make it dangerous to drive, ride a bike, or use machines. Some of these problems are:  A hard blow to the head (concussion).   Passing out (fainting).   Twitching and shaking (seizures).   Low blood sugar.   Taking medicine to help you relax (sedatives).   Taking pain medicines.   Wearing an eye patch.   Wearing splints. This can make it hard to use parts of your body that you need to drive safely.  HOME CARE   Do not drive until your doctor says it is okay.   Do not use machines until your doctor says it is okay.  You may need a form signed by your doctor (medical release) before you can drive again. You may also need this form before you do other tasks where you need to be fully alert. MAKE SURE YOU:  Understand these instructions.   Will watch your condition.   Will get help right away if you are not doing well or get worse.  Document Released: 08/04/2004 Document Revised: 06/16/2011 Document Reviewed: 11/04/2009 [facility] Patient Information 2012 [place], [place]. RESOURCE GUIDE  Chronic Pain Problems: Contact [facility]  [phone]  Patients need to be referred by their primary care doctor.  Insufficient Money for Medicine: Contact United Way:  call "211" or [facility] 4193362288.  No Primary Care Doctor: - Call Health Connect  [phone] - can help you locate a primary care doctor that  accepts your insurance, provides certain services, etc. - Physician Referral Service- [phone]  Agencies that provide inexpensive medical care: - [facility]  [phone] - [facility]  [phone] - [facility]  [phone] - Women's Clinic  [phone] - Planned Parenthood  [phone] [facility]  [phone]  Medicaid-accepting [facility] Providers: - [facility]- [address]  [phone], Mon-Fri 9am-7pm, Sat  9am-1pm - [facility]- [address] [place], Suite [place]  [phone] - [facility]- [address] Suite [place]  [phone] [facility]- [address]  [phone] - Renaye Rakers- [address] [place], [address], [phone]  Only accepts [place] patients after they have their name  applied to their card  Self Pay (no insurance) in [place]: - Sickle Cell Patients: Dr Willey Blade, [facility]  [address] [place], [phone] - [facility]- [address] [employer]  [phone]       [facility]- [address]       -     [facility]- see information above (Speak to [employer] if you do not have insurance)       -  [facility]- [address] [place], [phone]       -  [facility]- [address],  [phone]       -  [facility]- [address], [phone]       -  Dr Julio Sicks-  [address], [place], [phone]       -  [facility]- [address], [phone]       -  [facility]- [address], [phone], also [address] [phone]       -    [facility]- [address] [place], [phone], 1st & 3rd Saturday   every month, 10am-1pm  1) Find a Doctor and Pay Out of Pocket Although you won't have to find out who is covered by your insurance plan, it is a good idea to ask around and get recommendations. You will then need to call the office and see if the doctor you have chosen will accept you as a new patient and what types of options they offer for patients who are self-pay. Some doctors offer discounts or will set up payment plans for their patients who do not have insurance, but you will need to ask so you aren't surprised when you get to your appointment.  2) Contact Your Local Health Department Not all health departments have doctors that can see patients for  sick visits, but many do, so it is worth a call to see if yours does. If you don't know where your local health department is, you can check in your phone book. The CDC also has a tool to help you locate your state's health department, and many state websites also have listings of all of their local health departments.  3) Find a Walk-in  Clinic If your illness is not likely to be very severe or complicated, you may want to try a walk in clinic. These are popping up all over the country in pharmacies, drugstores, and shopping centers. They're usually staffed by nurse practitioners or physician assistants that have been trained to treat common illnesses and complaints. They're usually fairly quick and inexpensive. However, if you have serious medical issues or chronic medical problems, these are probably not your best option  STD Testing - [facility] Department of [facility], STD Clinic, [address] [place], phone [phone] or [phone].  Monday - Friday, call for an appointment. [facility] Department of [employer], STD Clinic, [place] [address], [place], phone [phone] or [phone].  Monday - Friday, call for an appointment.  Abuse/Neglect: [facility] Child Abuse Hotline (856) 099-7159 [facility] Child Abuse Hotline (604) 305-4509 (After Hours)  Emergency Shelter:  [facility] [phone]  Maternity Homes: - Room at the [place] [facility] (862)807-2083 - [facility] [phone]  MRSA Hotline #:   720-531-8890  [facility] Resources  [facility] [place]  United Way [facility] Dept. [address]                 [address]         [address] [place] [address]  Blondell Reveal Phone:  [phone]                                  Phone:  [phone]                   Phone:   [phone]  [facility], [phone] - [facility] - [phone]       -     [facility] in [place], [address]                                  [phone], [facility] Child Abuse Hotline (516) 487-5262 or 240-303-7653 (After Hours)   Behavioral Health Services  Substance Abuse Resources: - Alcohol and Drug Services  762-368-4944 - [facility] [phone] - [facility] [phone] Floydene Flock 640-050-4467 - Residential & Outpatient Substance Abuse Program  [phone]  Psychological Services: [facility]  [phone] Services  [phone] - [facility], [phone] [place]. [address] [place], ACCESS LINE: [phone] or [phone], [email or web address]  Dental Assistance  If unable to pay or uninsured, contact:  [facility] or [facility]. to become qualified for the adult dental clinic.  Patients with Medicaid: [facility] [phone] [address], [phone] [address], [phone]  If unable to pay, or uninsured, contact [facility] [phone]) or [facility] Department [phone] in  [place], [phone] in [facility]) to become qualified for the adult dental clinic  Other Low-Cost Community Dental Services: - [facility]- [address] [place], [place], [place], [address], [phone], Ext. 123, 2nd and 4th Thursday of the month at 6:30am.  10 clients each day by appointment, can sometimes see walk-in patients if someone does not show for an appointment. [facility]- [address] Ether Griffins [place], [place], [address], [phone] - [facility]- [address] [place], [place], [address], [phone] [facility] Health Department- [phone] [facility] Health Department- [phone] [facility] [phone] RESOURCE GUIDE  Chronic Pain Problems: Contact [facility]  [phone] Patients need to be referred by their primary care doctor.  Insufficient Money for Medicine: Contact United Way:  call "211" or [facility] (713)605-5875.  No Primary Care Doctor: Call Health Connect  ([phone] [phone] - can help you locate a primary care doctor that  accepts your insurance, provides certain services, etc. Physician Referral Service- [phone]  Agencies that provide inexpensive medical care: [facility]  [phone] [facility]  [phone] [facility]  [phone] [facility] Clinic  [phone] Planned Parenthood  [phone] [facility]  [phone]  Medicaid-accepting [facility] Providers: [facility]- [address], [address]  [phone], Mon-Fri 9am-7pm, Sat 9am-1pm [facility]- [address] [place], [address] [place]  [phone] [facility]- [address], Suite [place]  [phone] [facility]- [address]  [phone] Renaye Rakers- [address] [place], [address], [phone]  Only accepts [place] patients after they have their name  applied to their card  Self Pay (no insurance) in [place] [facility]: Sickle Cell Patients: Dr Willey Blade, [facility]  [address] [place], [phone] [facility]- [address] [place]  [phone]       [facility] [place]- [address]       -     [facility]- see information above (Speak to [employer] if you do not have insurance)       -  [facility]- [address] [place], [phone]       -  [facility]- [address],  [phone]       -  [facility]- [address] [phone]       -  Dr Julio Sicks-  [address], [place], [phone]       -  [facility]-  [address] [phone]       -  [facility]- [address], [phone], also [address] [phone]       -    [facility]- [address] [place], [phone], 1st & 3rd Saturday   every month, 10am-1pm  1) Find a Doctor and Pay Out of Pocket Although you won't have to find out who is covered by your insurance plan, it is a good idea to ask around and get recommendations. You will then need to call the office and see if the doctor you have chosen will accept you as a new patient and what types of options they offer for patients who are self-pay. Some doctors offer discounts or will set up payment plans for their patients who  do not have insurance, but you will need to ask so you aren't surprised when you get to your appointment.  2) Contact Your Local Health Department Not all health departments have doctors that can see patients for sick visits, but many do, so it is worth a call to see if yours does. If you don't know where your local health department is, you can check in your phone book. The CDC also has a tool to help you locate your state's health department, and many state websites also have listings of all of their local health departments.  3) Find a Walk-in Clinic If your illness is not likely to be very severe or complicated, you may want to try a walk in clinic. These are popping up all over the country in pharmacies, drugstores, and shopping centers. They're usually staffed by nurse practitioners or physician assistants that have been trained to treat common illnesses and complaints. They're usually fairly quick and inexpensive. However, if you have serious medical issues or chronic medical problems, these are probably not your best option  STD Testing [facility] Department of [facility], STD Clinic, [address] [place], phone [phone] or [phone].  Monday - Friday, call for an appointment. [facility]  Department of [employer], STD Clinic, [place] [address] [place], phone [phone] or [phone].  Monday - Friday, call for an appointment.  Abuse/Neglect: [facility] Child Abuse Hotline 567-325-6499 [facility] Child Abuse Hotline 707-115-3786 (After Hours)  Emergency Shelter:  [facility] 201-066-7564  Maternity Homes: Room at the [place] [facility] 830-020-8001 [facility] [phone]  MRSA Hotline #:   (530) 276-8218  [facility] Resources  [facility] [place]  United Way [facility] Dept. [address]                 [address]         [address] [place] [address]  Blondell Reveal Phone:  [phone]                                  Phone:  [phone]                   Phone:  [phone]  [facility], [phone] [facility] [place]- [phone]       -     [facility] in [place], [address],                                  [phone], [facility] Child Abuse Hotline (647)186-9561 or (707) 621-6731 (After Hours)   Behavioral Health Services  Substance Abuse Resources: Alcohol and Drug Services  (820)426-4666 [facility] (249) 370-8325 [facility] [place] [phone] [facility] 6153081720 Residential & Outpatient Substance Abuse Program  670-559-0359  Psychological Services: [employer] [facility]  (208)478-8719 [facility]  [phone] [facility], [phone] [place]. [address], [place], ACCESS LINE: (860) 569-5031 or (947)344-7580, [email or web address][email or web address]  Dental Assistance  If unable to pay or uninsured, contact:  [facility] or [facility]. to become qualified for the adult dental clinic.  Patients with Medicaid:  [facility] [phone]) [phone] [address], [phone] [address] [phone]  If unable to pay, or uninsured, contact [facility] 276-594-9927) or [facility] Department ([phone] in [place], [phone] in [facility]) to become qualified for the adult dental clinic  Other Low-Cost Community Dental Services: [facility]- [address] [place], [place], [place], [address], [phone], Ext. 123, 2nd and 4th Thursday of the month at 6:30am.  10 clients each day by appointment, can sometimes see walk-in patients if someone does not show for an appointment. [facility]- [address] Ether Griffins [place], [place], [address], [phone] [facility] [address] [place], [place], [address], [phone] [employer] [facility] Health Department- [phone] [facility] Health Department- [phone] [facility] [phone]

## 2011-12-20 NOTE — ED Provider Notes (Signed)
History     CSN: [id number]  Arrival date & time 12/18/11  4098   First MD Initiated Contact with Patient 12/18/11 1005      Chief Complaint  Patient presents with  . Seizures    (Consider location/radiation/quality/duration/timing/severity/associated sxs/prior treatment) HPI Patient is a 26 yo M with history of epilepsy who presents by EMS after having a generalized tonic-clonic seizure at church.  Patient has a little soreness at the back of his head but denies any other injuries.  He is oriented x 3 and has no concerning neurologic symptoms.  He reports feeling somewhat tired consistent with prior post-ictal states.  Patient reports compliance with his lamictal 250 mg po BID but has had 4 seizures in the past month.  He does not see a neurologist as he can not afford it and he has had no recent alcohol use, loss of sleep, or new meds that might explain the increase in his symptoms.There are no other associated or modifying factors.  Past Medical History  Diagnosis Date  . Seizure     Past Surgical History  Procedure Date  . Pyloric stenosis surgery     History reviewed. No pertinent family history.  History  Substance Use Topics  . Smoking status: Never Smoker   . Smokeless tobacco: Not on file  . Alcohol Use: Yes     occ      Review of Systems  Constitutional: Negative.   HENT: Negative.   Eyes: Negative.   Respiratory: Negative.   Cardiovascular: Negative.   Gastrointestinal: Negative.   Genitourinary: Negative.   Musculoskeletal: Negative.   Skin: Negative.   Neurological: Positive for seizures.  Hematological: Negative.   Psychiatric/Behavioral: Negative.   All other systems reviewed and are negative.    Allergies  Clarithromycin  Home Medications   Current Outpatient Rx  Name Route Sig Dispense Refill  . ACETAMINOPHEN 325 MG PO TABS Oral Take 650 mg by mouth every 6 (six) hours as needed. Patient used this medication for a headache.    .  IBUPROFEN 200 MG PO TABS Oral Take 400 mg by mouth daily. As needed for pain     . LAMOTRIGINE 100 MG PO TABS Oral Take 250 mg by mouth 2 (two) times daily.     Marland Kitchen LEVETIRACETAM 250 MG PO TABS Oral Take 1 tablet (250 mg total) by mouth 2 (two) times daily. 60 tablet 1    BP 121/63  Pulse 78  Temp(Src) 98.4 F (36.9 C) (Oral)  Resp 18  SpO2 99%  Physical Exam  Nursing note and vitals reviewed. GEN: Well-developed, well-nourished male in no distress HEENT: Atraumatic, normocephalic.  EYES: PERRLA BL, no scleral icterus. NECK: Trachea midline, no meningismus CV: regular rate and rhythm.  PULM: No respiratory distress.  . Neuro: cranial nerves 2-12 grossly intact, no abnormalities of strength or sensation, A and O x 3 MSK: Patient moves all 4 extremities symmetrically, no deformity, edema, or injury noted Skin: No rashes petechiae, purpura, or jaundice Psych: no abnormality of mood   ED Course  Procedures (including critical care time)  Labs Reviewed  BASIC METABOLIC PANEL - Abnormal; Notable for the following:    Glucose, Bld 117 (*)    All other components within normal limits   No results found.   1. Seizure       MDM  Patient was evaluated by myself.  Renal panel was checked and patient ws given 1 L NS IV bolus.  Labs were within normal  limits.  Given patient difficulty with follow-up I did discuss increasing his medications with Dr. Thad Ranger of neurology.  She felt that the addition of Keppra 250 mg po BID would be better than increasing lamictal dose.  Patient was given prescription with one month of refills and resource guide.  Dr. Thad Ranger had no other suggestions for obtaining outpatient neurologic follow-up.  Patient was discharged home in good condition.        Cyndra Numbers, MD 12/20/11 1056

## 2011-12-31 ENCOUNTER — Observation Stay (HOSPITAL_BASED_OUTPATIENT_CLINIC_OR_DEPARTMENT_OTHER)
Admission: EM | Admit: 2011-12-31 | Discharge: 2012-01-01 | Discharge status: Against Medical Advice | Payer: PRIVATE HEALTH INSURANCE | Attending: Emergency Medicine | Admitting: Emergency Medicine

## 2011-12-31 ENCOUNTER — Emergency Department (HOSPITAL_BASED_OUTPATIENT_CLINIC_OR_DEPARTMENT_OTHER): Payer: PRIVATE HEALTH INSURANCE

## 2011-12-31 ENCOUNTER — Encounter (HOSPITAL_COMMUNITY): Payer: Self-pay | Admitting: Internal Medicine

## 2011-12-31 DIAGNOSIS — R000 Tachycardia, unspecified: Secondary | ICD-10-CM | POA: Diagnosis present

## 2011-12-31 DIAGNOSIS — G40309 Generalized idiopathic epilepsy and epileptic syndromes, not intractable, without status epilepticus: Principal | ICD-10-CM | POA: Insufficient documentation

## 2011-12-31 DIAGNOSIS — E860 Dehydration: Secondary | ICD-10-CM | POA: Insufficient documentation

## 2011-12-31 DIAGNOSIS — I959 Hypotension, unspecified: Secondary | ICD-10-CM

## 2011-12-31 DIAGNOSIS — G40901 Epilepsy, unspecified, not intractable, with status epilepticus: Secondary | ICD-10-CM

## 2011-12-31 DIAGNOSIS — R569 Unspecified convulsions: Secondary | ICD-10-CM | POA: Diagnosis present

## 2011-12-31 HISTORY — DX: Juvenile myoclonic epilepsy, not intractable, without status epilepticus: G40.B09

## 2011-12-31 LAB — BASIC METABOLIC PANEL
GFR calc Af Amer: 80 mL/min — ABNORMAL LOW (ref >90)
GFR calc non Af Amer: 69 mL/min — ABNORMAL LOW (ref >90)
Potassium: 3.8 mEq/L (ref 3.5–5.1)
Sodium: 146 mEq/L — ABNORMAL HIGH (ref 135–145)

## 2011-12-31 LAB — DIFFERENTIAL
Basophils Relative: 0 % (ref 0–1)
Eosinophils Absolute: 0.1 10*3/uL (ref 0.0–0.7)
Neutrophils Relative %: 50 % (ref 43–77)

## 2011-12-31 LAB — CBC
MCH: 31.6 pg (ref 26.0–34.0)
MCHC: 33.9 g/dL (ref 30.0–36.0)
Platelets: 381 10*3/uL (ref 150–400)
RBC: 5.35 MIL/uL (ref 4.22–5.81)

## 2011-12-31 LAB — MAGNESIUM: Magnesium: 2.2 mg/dL (ref 1.5–2.5)

## 2011-12-31 MED ORDER — ONDANSETRON HCL 4 MG/2ML IJ SOLN: 4.0000 mg | Freq: Four times a day (QID) | INTRAMUSCULAR | Status: DC | PRN

## 2011-12-31 MED ORDER — ACETAMINOPHEN 325 MG PO TABS: 650.0000 mg | ORAL_TABLET | Freq: Four times a day (QID) | ORAL | Status: DC | PRN

## 2011-12-31 MED ORDER — SODIUM CHLORIDE 0.9 % IV SOLN
INTRAVENOUS | Status: DC
  Administered 2011-12-31: 22:00:00 via INTRAVENOUS

## 2011-12-31 MED ORDER — SODIUM CHLORIDE 0.9 % IJ SOLN: 3.0000 mL | Freq: Two times a day (BID) | INTRAMUSCULAR | Status: DC

## 2011-12-31 MED ORDER — LORAZEPAM 2 MG/ML IJ SOLN
2.0000 mg | Freq: Once | INTRAMUSCULAR | Status: AC
  Administered 2011-12-31: 2 mg via INTRAVENOUS

## 2011-12-31 MED ORDER — SODIUM CHLORIDE 0.9 % IV BOLUS (SEPSIS)
1000.0000 mL | Freq: Once | INTRAVENOUS | Status: AC
  Administered 2011-12-31: 1000 mL via INTRAVENOUS

## 2011-12-31 MED ORDER — SODIUM CHLORIDE 0.9 % IV SOLN
INTRAVENOUS | Status: AC
  Administered 2011-12-31: 17:00:00 via INTRAVENOUS

## 2011-12-31 MED ORDER — ACETAMINOPHEN 650 MG RE SUPP: 650.0000 mg | Freq: Four times a day (QID) | RECTAL | Status: DC | PRN

## 2011-12-31 MED ORDER — ONDANSETRON HCL 4 MG/2ML IJ SOLN
INTRAMUSCULAR | Status: AC
  Administered 2011-12-31: 4 mg via INTRAVENOUS
  Filled 2011-12-31: qty 2

## 2011-12-31 MED ORDER — LAMOTRIGINE 150 MG PO TABS
300.0000 mg | ORAL_TABLET | Freq: Two times a day (BID) | ORAL | Status: DC
  Administered 2011-12-31: 300 mg via ORAL
  Filled 2011-12-31: qty 2

## 2011-12-31 MED ORDER — HYDROCODONE-ACETAMINOPHEN 5-325 MG PO TABS: 1.0000 | ORAL_TABLET | ORAL | Status: DC | PRN

## 2011-12-31 MED ORDER — ONDANSETRON HCL 4 MG/2ML IJ SOLN
4.0000 mg | Freq: Once | INTRAMUSCULAR | Status: AC
  Administered 2011-12-31: 4 mg via INTRAVENOUS

## 2011-12-31 MED ORDER — ALBUTEROL SULFATE (5 MG/ML) 0.5% IN NEBU: 2.5000 mg | INHALATION_SOLUTION | RESPIRATORY_TRACT | Status: DC | PRN

## 2011-12-31 MED ORDER — ONDANSETRON HCL 4 MG PO TABS: 4.0000 mg | ORAL_TABLET | Freq: Four times a day (QID) | ORAL | Status: DC | PRN

## 2011-12-31 MED ORDER — LORAZEPAM 2 MG/ML IJ SOLN
INTRAMUSCULAR | Status: AC
  Administered 2011-12-31: 2 mg via INTRAVENOUS
  Filled 2011-12-31: qty 1

## 2011-12-31 NOTE — ED Notes (Signed)
Has called [facility] for transfer to [facility]

## 2011-12-31 NOTE — H&P (Signed)
PCP:  No PCP   Chief Complaint:   seizures  HPI: Paul Dorsey is a 26 y.o. male   has a past medical history of Seizure.   Presented with  Worsening pattern of seizure activity for the past month he is currently on Lamictal 250mg  BID was also supposed to start on Keppra but states he was not aware of that. When questioned actually it seems that he had 3 seizures past month 2 of them today and 1 2 weeks ago. He has history of JME. He have had some financial difficulties and states may have trouble affording some of them. HE had a few episodes today and was seen at [facility] was given ativan. Neurology is seeing patient and will give recommendations regarding choice of medications. Denies alcohol abuse. Not sure if he is getting less sleep. Reports trouble with memory.  Review of Systems:    Pertinent positives include: memory problems, episodes of seizures  Constitutional:  No weight loss, night sweats, Fevers, chills, fatigue, weight loss  HEENT:  No headaches, Difficulty swallowing,Tooth/dental problems,Sore throat,  No sneezing, itching, ear ache, nasal congestion, post nasal drip,  Cardio-vascular:  No chest pain, Orthopnea, PND, anasarca, dizziness, palpitations.no Bilateral lower extremity swelling  GI:  No heartburn, indigestion, abdominal pain, nausea, vomiting, diarrhea, change in bowel habits, loss of appetite, melena, blood in stool, hematemesis Resp:  no shortness of breath at rest. No dyspnea on exertion, No excess mucus, no productive cough, No non-productive cough, No coughing up of blood.No change in color of mucus.No wheezing. Skin:  no rash or lesions. No jaundice GU:  no dysuria, change in color of urine, no urgency or frequency. No straining to urinate.  No flank pain.  Musculoskeletal:  No joint pain or no joint swelling. No decreased range of motion. No back pain.  Psych:  No change in mood or affect. No depression or anxiety. No memory loss.  Neuro: no  localizing neurological complaints, no tingling, no weakness, no double vision, no gait abnormality, no slurred speech, no confusion  Otherwise ROS are negative except for above, 10 systems were reviewed  Past Medical History: Past Medical History  Diagnosis Date  . Seizure    Past Surgical History  Procedure Date  . Pyloric stenosis surgery      Medications: Prior to Admission medications   Medication Sig Start Date End Date Taking? Authorizing Provider  acetaminophen (TYLENOL) 325 MG tablet Take 650 mg by mouth every 6 (six) hours as needed. Patient used this medication for a headache.    Historical Provider, MD  ibuprofen (ADVIL,MOTRIN) 200 MG tablet Take 400 mg by mouth daily. As needed for pain     Historical Provider, MD  lamoTRIgine (LAMICTAL) 100 MG tablet Take 250 mg by mouth 2 (two) times daily.     Historical Provider, MD  levETIRAcetam (KEPPRA) 250 MG tablet Take 1 tablet (250 mg total) by mouth 2 (two) times daily. 12/18/11 12/17/12  Cyndra Numbers, MD    Allergies:   Allergies  Allergen Reactions  . Clarithromycin Nausea And Vomiting    Social History:  Ambulatory  independently  Lives at  home   reports that he has never smoked. He does not have any smokeless tobacco history on file. He reports that he drinks alcohol. He reports that he does not use illicit drugs.   Family History: family history is not on file.    Physical Exam: Patient Vitals for the past 24 hrs:  BP Temp Temp src  Pulse Resp SpO2 Height Weight  12/31/11 1750 93/45 mmHg 98.7 F (37.1 C) Oral 74  17  92 % 6\' 1"  (1.854 m) 82.101 kg (181 lb)  12/31/11 1641 115/61 mmHg - - 84  14  100 % - -  12/31/11 1522 105/59 mmHg - - 86  18  100 % - -  12/31/11 1421 117/56 mmHg - - 100  16  100 % - -  12/31/11 1401 132/64 mmHg - - 118  23  98 % - -  12/31/11 1357 132/64 mmHg 99.2 F (37.3 C) Oral 122  20  98 % - -    1. General:  in No Acute distress 2. Psychological: Alert and Oriented 3. Head/ENT:     Dry Mucous Membranes                          Head Non traumatic, neck supple                          Normal  Dentition 4. SKIN:  decreased Skin turgor,  Skin clean Dry and intact no rash 5. Heart: Regular rate and rhythm no Murmur, Rub or gallop 6. Lungs: Clear to auscultation bilaterally, no wheezes or crackles   7. Abdomen: Soft, non-tender, Non distended 8. Lower extremities: no clubbing, cyanosis, or edema 9. Neurologically Grossly intact, moving all 4 extremities equally, CN 2-13 intact. Strength 5/5 in all 4 ext 10. MSK: Normal range of motion  body mass index is 23.88 kg/(m^2).   Labs on Admission:   Basename 12/31/11 1400  NA 146*  K 3.8  CL 100  CO2 15*  GLUCOSE 164*  BUN 13  CREATININE 1.40*  CALCIUM 10.1  MG 2.2  PHOS --   No results found for this basename: AST:2,ALT:2,ALKPHOS:2,BILITOT:2,PROT:2,ALBUMIN:2 in the last 72 hours No results found for this basename: LIPASE:2,AMYLASE:2 in the last 72 hours  Basename 12/31/11 1400  WBC 13.0*  NEUTROABS 6.5  HGB 16.9  HCT 49.9  MCV 93.3  PLT 381   No results found for this basename: CKTOTAL:3,CKMB:3,CKMBINDEX:3,TROPONINI:3 in the last 72 hours No results found for this basename: TSH,T4TOTAL,FREET3,T3FREE,THYROIDAB in the last 72 hours No results found for this basename: VITAMINB12:2,FOLATE:2,FERRITIN:2,TIBC:2,IRON:2,RETICCTPCT:2 in the last 72 hours No results found for this basename: HGBA1C    Estimated Creatinine Clearance: 91.9 ml/min (by C-G formula based on Cr of 1.4). ABG    Component Value Date/Time   TCO2 24 10/29/2010 1215     No results found for this basename: DDIMER     Other results:  I have pearsonaly reviewed this: ECG REPORT  Rate: 111  Rhythm: sinus Tachy incomp. RBBB ST&T Change: no ischemia   Cultures: No results found for this basename: sdes, specrequest, cult, reptstatus       Radiological Exams on Admission: Dg Chest Port 1 View  12/31/2011  *RADIOLOGY REPORT*   Clinical Data: The patient is laying on a stretcher and unresponsive.  PORTABLE CHEST - 1 VIEW  Comparison: 09/23/2010  Findings: Normal heart size.  Clear lungs.  No pneumothorax or pleural effusion.  IMPRESSION: No active cardiopulmonary disease.  Original Report Authenticated By: Donavan Burnet, M.D.    Assessment/Plan  26 yo with Hx of JME here with epizodes of seizure twice today. Appears dehydrated.  Present on Admission:  .Seizures - appreciate neurology consult, as per their recs will increase dose of lamictal to 300mg  po BID .Dehydration -  Give IVF, follow up Cr, and orthostatics. This probably has contributed to setting lower seizure threshold   .Hypotension - transient, continue with IVF  .Tachycardia - also transient in the setting of recent seizure and dehydration, will give IVF and repeat ECG    Prophylaxis: SCD   CODE STATUS: FULL   Other plan as per orders.  I have spent a total of  65 min on this admission time taken discussing care with patient and consultant.   Jailen Coward 12/31/2011, 8:37 PM

## 2011-12-31 NOTE — Consult Note (Signed)
Chief Complaint: seizure  HPI:  This is a 26 year old with history of JME who presents with breakthrough seizures.  Patient is not able to recall today's events and there are no witnesses present during time of evaluation.  Patient's last memory was that of being taking into an ambulance.  His typical seizure semiology involves generalized shaking of upper and lower extremities which does not appear to have been different today.  He usually averages about 1 seizure per month but over this month his has experienced a total of 3 with 2 of those being today.  He does not recall how long the seizure lasted and he was reported to be postictal for a period of 1-2 hours.  He denies any urinary incontinence or tongue biting. During last hospital admission he was prescribed keppra in addition to lamictal but did not fill the medication due to financial limitations and he currently is not being followed by a neurologist. He is tolerating lamictal well with no known side effects and reports he only missed the morning dose. He denies any use of alcohol or recent illnesses but he does state that he has not been sleeping well over the last couple of weeks.  Past Medical History  Diagnosis Date  . Seizure     Past Surgical History  Procedure Date  . Pyloric stenosis surgery     No family history on file. Social History:  reports that he has never smoked. He does not have any smokeless tobacco history on file. He reports that he drinks alcohol. He reports that he does not use illicit drugs.  Allergies:  Allergies  Allergen Reactions  . Clarithromycin Nausea And Vomiting    Medications Prior to Admission  Medication Sig Dispense Refill  . acetaminophen (TYLENOL) 325 MG tablet Take 650 mg by mouth every 6 (six) hours as needed. Patient used this medication for a headache.      . ibuprofen (ADVIL,MOTRIN) 200 MG tablet Take 400 mg by mouth daily. As needed for pain       . lamoTRIgine (LAMICTAL) 100 MG  tablet Take 250 mg by mouth 2 (two) times daily.       Marland Kitchen levETIRAcetam (KEPPRA) 250 MG tablet Take 1 tablet (250 mg total) by mouth 2 (two) times daily.  60 tablet  1     ROS: All systems reviewed and are negative except as noted above  Blood pressure 93/45, pulse 74, temperature 98.7 F (37.1 C), temperature source Oral, resp. rate 17, height 6\' 1"  (1.854 m), weight 82.101 kg (181 lb), SpO2 92.00%.   General Examination: Heart: RRR Lungs: CTAB Abdomen: soft, NT, ND  Neuro exam: MS: Alert and oriented times three, speech fluent, able to follow simple and complex commands, able to name, repeat, and comprehend CN: PERRLA, EOMI, VFF, Intact facial sensation in V1-V3 distributions, face symmetric, hearing grossly intact, symmetric rise of palate and uvula, symmetric shoulder shrug, tongue midline Motor: 5/5 throughout, no drift Reflexes: brisk throughout Sensation: intact to lt Coordination: intact fnf Gait: deferred  Results for orders placed during the hospital encounter of 12/31/11 (from the past 48 hour(s))  CBC     Status: Abnormal   Collection Time   12/31/11  2:00 PM      Component Value Range Comment   WBC 13.0 (*) 4.0 - 10.5 K/uL    RBC 5.35  4.22 - 5.81 MIL/uL    Hemoglobin 16.9  13.0 - 17.0 g/dL    HCT 49.9  39.0 - 52.0 %    MCV 93.3  78.0 - 100.0 fL    MCH 31.6  26.0 - 34.0 pg    MCHC 33.9  30.0 - 36.0 g/dL    RDW 16.1  09.6 - 04.5 %    Platelets 381  150 - 400 K/uL   DIFFERENTIAL     Status: Abnormal   Collection Time   12/31/11  2:00 PM      Component Value Range Comment   Neutrophils Relative 50  43 - 77 %    Neutro Abs 6.5  1.7 - 7.7 K/uL    Lymphocytes Relative 38  12 - 46 %    Lymphs Abs 5.0 (*) 0.7 - 4.0 K/uL    Monocytes Relative 11  3 - 12 %    Monocytes Absolute 1.5 (*) 0.1 - 1.0 K/uL    Eosinophils Relative 1  0 - 5 %    Eosinophils Absolute 0.1  0.0 - 0.7 K/uL    Basophils Relative 0  0 - 1 %    Basophils Absolute 0.0  0.0 - 0.1 K/uL   BASIC  METABOLIC PANEL     Status: Abnormal   Collection Time   12/31/11  2:00 PM      Component Value Range Comment   Sodium 146 (*) 135 - 145 mEq/L    Potassium 3.8  3.5 - 5.1 mEq/L    Chloride 100  96 - 112 mEq/L    CO2 15 (*) 19 - 32 mEq/L    Glucose, Bld 164 (*) 70 - 99 mg/dL    BUN 13  6 - 23 mg/dL    Creatinine, Ser 4.09 (*) 0.50 - 1.35 mg/dL    Calcium 81.1  8.4 - 10.5 mg/dL    GFR calc non Af Amer 69 (*) >90 mL/min    GFR calc Af Amer 80 (*) >90 mL/min   MAGNESIUM     Status: Normal   Collection Time   12/31/11  2:00 PM      Component Value Range Comment   Magnesium 2.2  1.5 - 2.5 mg/dL    Dg Chest Port 1 View  12/31/2011  *RADIOLOGY REPORT*  Clinical Data: The patient is laying on a stretcher and unresponsive.  PORTABLE CHEST - 1 VIEW  Comparison: 09/23/2010  Findings: Normal heart size.  Clear lungs.  No pneumothorax or pleural effusion.  IMPRESSION: No active cardiopulmonary disease.  Original Report Authenticated By: Donavan Burnet, M.D.    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  1.) Seizures- Would recommend to increase lamictal to 300 mg bid.  He states that he is hopeful that he would be able to receive his medications through an assistance program.  Would hold on addition of a second agent because of the financial limitations and patient is not currently seeing a physician to monitor levels. 2.) He was advised to follow up with an outpatient neurologist or primary care provider.  Yacoub Diltz 12/31/2011, 8:19 PM

## 2011-12-31 NOTE — ED Notes (Signed)
Attempt to call report to [phone]- nurse will call back

## 2011-12-31 NOTE — Progress Notes (Signed)
Pt admitted to room 3009 via EMS from [facility] after having 2 witnessed seizures.  Pt has a history of seizures and was sent home with Kepra prescription on last admission but was unable to afford it.  Pt is resting in bed; very lethargic/postictal state.  Pt unable to complete admission questions at this time.  Suctioning set-up in room.  Pt made comfortable .  Will attempt to page attending for any new orders.  Dinner tray ordered.  Will continue to monitor.

## 2011-12-31 NOTE — ED Notes (Signed)
Seizure pads placed for safety. Patients sheets changed after multiple episodes of vomiting. Patient cleaned and skin dried. Clean, dry sheets placed

## 2011-12-31 NOTE — ED Notes (Signed)
Patient was at a cookout and had a witnessed seizure, brought her in vehicle by people at the cookout

## 2011-12-31 NOTE — ED Provider Notes (Signed)
History     CSN: [id number]  Arrival date & time 12/31/11  1353   First MD Initiated Contact with Patient 12/31/11 1355      Chief Complaint  Patient presents with  . Seizures    (Consider location/radiation/quality/duration/timing/severity/associated sxs/prior treatment) HPI - Level 5 caveat due to AMS Pt brought to the ED by friends after having a witnessed seizure at an outdoor cookout just prior to arrival. He has a long history of poorly controlled seizures with multiple recent ED visits for same. He was not at baseline on the ride here and had a second seizure in the room witnessed by nursing. He was somnolent on my arrival to the room after getting IV Ativan. He has reportedly been taking his medications, however he was prescribed a second agent, Keppra, at a recent visit but did not get it filled due to financial problems.   Past Medical History  Diagnosis Date  . Seizure     Past Surgical History  Procedure Date  . Pyloric stenosis surgery     No family history on file.  History  Substance Use Topics  . Smoking status: Never Smoker   . Smokeless tobacco: Not on file  . Alcohol Use: Yes     occ      Review of Systems All other systems reviewed and are negative except as noted in HPI.   Allergies  Clarithromycin  Home Medications   Current Outpatient Rx  Name Route Sig Dispense Refill  . ACETAMINOPHEN 325 MG PO TABS Oral Take 650 mg by mouth every 6 (six) hours as needed. Patient used this medication for a headache.    . IBUPROFEN 200 MG PO TABS Oral Take 400 mg by mouth daily. As needed for pain     . LAMOTRIGINE 100 MG PO TABS Oral Take 250 mg by mouth 2 (two) times daily.     Marland Kitchen LEVETIRACETAM 250 MG PO TABS Oral Take 1 tablet (250 mg total) by mouth 2 (two) times daily. 60 tablet 1    BP 132/64  Pulse 118  Resp 23  SpO2 98%  Physical Exam  Nursing note and vitals reviewed. Constitutional: He appears well-developed and well-nourished.  HENT:    Head: Normocephalic and atraumatic.  Eyes: Pupils are equal, round, and reactive to light.  Neck: Normal range of motion. Neck supple.  Cardiovascular: Normal rate, normal heart sounds and intact distal pulses.   Pulmonary/Chest: Effort normal and breath sounds normal. He has no wheezes. He has no rales.  Abdominal: Soft. Bowel sounds are normal. He exhibits no distension.  Musculoskeletal: Normal range of motion. He exhibits no edema.  Neurological:       Unable to assess due to mental status, responds to voice  Skin: Skin is warm and dry. No rash noted.  Psychiatric: He has a normal mood and affect.    ED Course  Procedures (including critical care time)  Labs Reviewed  CBC - Abnormal; Notable for the following:    WBC 13.0 (*)     All other components within normal limits  DIFFERENTIAL - Abnormal; Notable for the following:    Lymphs Abs 5.0 (*)     Monocytes Absolute 1.5 (*)     All other components within normal limits  BASIC METABOLIC PANEL - Abnormal; Notable for the following:    Sodium 146 (*)     CO2 15 (*)     Glucose, Bld 164 (*)     Creatinine, Ser 1.40 (*)  GFR calc non Af Amer 69 (*)     GFR calc Af Amer 80 (*)     All other components within normal limits  MAGNESIUM   Dg Chest Port 1 View  12/31/2011  *RADIOLOGY REPORT*  Clinical Data: The patient is laying on a stretcher and unresponsive.  PORTABLE CHEST - 1 VIEW  Comparison: 09/23/2010  Findings: Normal heart size.  Clear lungs.  No pneumothorax or pleural effusion.  IMPRESSION: No active cardiopulmonary disease.  Original Report Authenticated By: Donavan Burnet, M.D.     No diagnosis found.    MDM   Date: 12/31/2011  Rate: 113  Rhythm: sinus tachycardia  QRS Axis: right  Intervals: normal  ST/T Wave abnormalities: normal  Conduction Disutrbances:Incomplete RBBB  Narrative Interpretation:   Old EKG Reviewed: none available  3:36 PM Pt remains somnolent, but responds to verbal stimuli. Pt  appear dehydrated by labs. Discussed seizures with Dr. Adline Peals on call for Neurology who agrees with plan to admit to Hospitalist for hydration, seizure control and to help arrange outpatient resources for improved long term seizure control.        Marigny Borre B. Bernette Mayers, MD 12/31/11 1537

## 2011-12-31 NOTE — Progress Notes (Signed)
Pt is alert and oriented X4. He sts he is hungry and food tray was given. Pt sts no complaints but is very anxious and wants to leave. Sts he has a job interview tomorrow that he can't miss.He also sts he needs his medication because he didn't take any all day. Explained to pt that  MD will come up to see him and he can ask questions at that time about the length of stay. Will continue to monitor pt.

## 2012-01-01 DIAGNOSIS — G40319 Generalized idiopathic epilepsy and epileptic syndromes, intractable, without status epilepticus: Secondary | ICD-10-CM

## 2012-01-30 NOTE — Discharge Summary (Signed)
DISCHARGE SUMMARY  Paul Dorsey  MR#: [id number]  DOB:[birth year]  Date of Admission: 12/31/2011 Date of Discharge: 01/01/2012  Patient's PCP:No primary provider on file.  Consults: Inpatient Neurology Services  Discharge Diagnoses: Present on Admission:  .Dehydration .Seizures .Hypotension .Tachycardia   Medication List  As of 01/30/2012  4:01 PM   ASK your doctor about these medications         acetaminophen 325 MG tablet   Commonly known as: TYLENOL   Take 650 mg by mouth every 6 (six) hours as needed. Patient used this medication for a headache.      ibuprofen 200 MG tablet   Commonly known as: ADVIL,MOTRIN   Take 400 mg by mouth daily. As needed for pain      lamoTRIgine 100 MG tablet   Commonly known as: LAMICTAL   Take 300 mg by mouth 2 (two) times daily.      levETIRAcetam 250 MG tablet   Commonly known as: KEPPRA   Take 1 tablet (250 mg total) by mouth 2 (two) times daily.           Hospital Course: Present on Admission:  .Dehydration .Seizures .Hypotension .Tachycardia: This is a 26 year old with history of JME who presents with breakthrough seizures. Patient is not able to recall today's events and there are no witnesses present during time of evaluation. Patient's last memory was that of being taking into an ambulance. His typical seizure semiology involves generalized shaking of upper and lower extremities which does not appear to have been different today. He usually averages about 1 seizure per month but over this month his has experienced a total of 3 with 2 of those being today. He does not recall how long the seizure lasted and he was reported to be postictal for a period of 1-2 hours. He denies any urinary incontinence or tongue biting. During last hospital admission he was prescribed keppra in addition to lamictal but did not fill the medication due to financial limitations and he currently is not being followed by a neurologist. He is tolerating  lamictal well with no known side effects and reports he only missed the morning dose. He denies any use of alcohol or recent illnesses but he does state that he has not been sleeping well over the last couple of weeks.  Pt received IVFs and seen by neurology and they recommended increasing lamictal to 300 bid.  Pt left against medical advice before being seen because of a job interview.  Risks explained to pt who verbalized understanding.    Discharge Condition - Stable   Pt left against medical advice because he had a job interview and was very anxious to leave.  He says he will followup with neurologist and take medications as prescribed   Day of Discharge BP 116/64  Pulse 75  Temp 98.7 F (37.1 C) (Oral)  Resp 18  Ht 6\' 1"  (1.854 m)  Wt 82.101 kg (181 lb)  BMI 23.88 kg/m2  SpO2 97%   Follow-up Appts: Follow up with neurologist in 1 week for hospital follow up Follow up with primary care physician in 2 days for recheck.   Signed: Clanford Johnson 01/30/2012, 4:01 PM

## 2012-02-06 ENCOUNTER — Encounter (HOSPITAL_COMMUNITY): Payer: Self-pay

## 2012-02-06 ENCOUNTER — Emergency Department (HOSPITAL_COMMUNITY)
Admission: EM | Admit: 2012-02-06 | Discharge: 2012-02-06 | Disposition: A | Discharge status: Home / Self Care | Payer: PRIVATE HEALTH INSURANCE | Attending: Emergency Medicine | Admitting: Emergency Medicine

## 2012-02-06 DIAGNOSIS — G40909 Epilepsy, unspecified, not intractable, without status epilepticus: Secondary | ICD-10-CM

## 2012-02-06 DIAGNOSIS — R569 Unspecified convulsions: Secondary | ICD-10-CM | POA: Insufficient documentation

## 2012-02-06 MED ORDER — ONDANSETRON HCL 4 MG/2ML IJ SOLN
INTRAMUSCULAR | Status: AC
  Administered 2012-02-06: 4 mg
  Filled 2012-02-06: qty 2

## 2012-02-06 MED ORDER — ONDANSETRON HCL 4 MG/2ML IJ SOLN
INTRAMUSCULAR | Status: AC
  Filled 2012-02-06: qty 2

## 2012-02-06 NOTE — ED Notes (Signed)
Lunch tray ordered 

## 2012-02-06 NOTE — ED Notes (Signed)
Pt presents to ED after having a seizure at the [employer] store. Pt was applying for a job per [employer]. Bystanders states pt had 2 seizures approximately 2 minutes total. Pt was postictal on ems arrival. Ems gave 4mg  zofran for vomting. 20 gauge LAC

## 2012-02-06 NOTE — ED Provider Notes (Signed)
History     CSN: [id number]  Arrival date & time 02/06/12  1144   First MD Initiated Contact with Patient 02/06/12 1203      Chief Complaint  Patient presents with  . Seizures    (Consider location/radiation/quality/duration/timing/severity/associated sxs/prior treatment) HPI Comments: Pt presents via EMS for evaluation of a seizure.  He was sitting at a table applying for a job when he is reported to fall to the floor jerking.  He had 2 witnessed seizure events and was confused s/p.  Currently he reports feeling fine and states he never has an aura or any warning that it might happen.  Patient is a 26 y.o. male presenting with seizures. The history is provided by the patient. No language interpreter was used.  Seizures  This is a recurrent problem. The current episode started less than 1 hour ago. There were 2 to 3 seizures. Associated symptoms include patient experiences confusion, nausea and vomiting. Pertinent negatives include no sleepiness, no headaches, no speech difficulty, no visual disturbance, no neck stiffness, no sore throat, no chest pain, no cough, no diarrhea and no muscle weakness. Characteristics include eye blinking, eye deviation, rhythmic jerking, loss of consciousness and bit tongue. Characteristics do not include bowel incontinence, bladder incontinence, apnea or cyanosis. The episode was witnessed. There was no sensation of an aura present. pt denies any There has been no fever. Meds prior to arrival: zofran.    Past Medical History  Diagnosis Date  . Seizure   . JME (juvenile myoclonic epilepsy)     Past Surgical History  Procedure Date  . Pyloric stenosis surgery     History reviewed. No pertinent family history.  History  Substance Use Topics  . Smoking status: Never Smoker   . Smokeless tobacco: Not on file  . Alcohol Use: Yes     occ      Review of Systems  Constitutional: Negative.   HENT: Negative.  Negative for sore throat.   Eyes:  Negative.  Negative for visual disturbance.  Respiratory: Negative for apnea and cough.   Cardiovascular: Negative for chest pain and cyanosis.  Gastrointestinal: Positive for nausea and vomiting. Negative for diarrhea, abdominal distention and bowel incontinence.       States nausea now resolved.  Genitourinary: Negative for bladder incontinence, dysuria, urgency, frequency, hematuria and flank pain.  Musculoskeletal: Negative for back pain, joint swelling, arthralgias and gait problem.  Neurological: Positive for seizures, loss of consciousness and light-headedness. Negative for dizziness, syncope, speech difficulty, weakness, numbness and headaches.  Hematological: Negative for adenopathy.  Psychiatric/Behavioral: Positive for confusion.    Allergies  Clarithromycin  Home Medications   Current Outpatient Rx  Name Route Sig Dispense Refill  . ACETAMINOPHEN 325 MG PO TABS Oral Take 650 mg by mouth every 6 (six) hours as needed. Patient used this medication for a headache.    . IBUPROFEN 200 MG PO TABS Oral Take 400 mg by mouth daily. As needed for pain    . LAMOTRIGINE 100 MG PO TABS Oral Take 250 mg by mouth 2 (two) times daily.       BP 131/75  Pulse 91  Temp 98.4 F (36.9 C) (Oral)  Resp 20  SpO2 99%  Physical Exam  Constitutional: He appears well-developed and well-nourished. No distress.  HENT:  Head: Normocephalic and atraumatic.  Right Ear: External ear normal.  Left Ear: External ear normal.  Nose: Nose normal.  Mouth/Throat: Oropharynx is clear and moist. No oropharyngeal exudate.  Eyes: Conjunctivae  and EOM are normal. Pupils are equal, round, and reactive to light. Right eye exhibits no discharge. Left eye exhibits no discharge. No scleral icterus.  Neck: Normal range of motion. Neck supple. No JVD present. No tracheal deviation present. No thyromegaly present.  Cardiovascular: Normal rate, regular rhythm, normal heart sounds and intact distal pulses.  Exam  reveals no gallop and no friction rub.   No murmur heard. Pulmonary/Chest: Effort normal and breath sounds normal. No stridor. No respiratory distress. He has no wheezes. He has no rales. He exhibits no tenderness.  Abdominal: Soft. Bowel sounds are normal. He exhibits no distension and no mass. There is no tenderness. There is no rebound and no guarding.  Musculoskeletal: Normal range of motion. He exhibits no edema and no tenderness.  Neurological: He is alert. He has normal reflexes. He displays no tremor. No cranial nerve deficit or sensory deficit. He exhibits normal muscle tone. He displays no seizure activity. GCS eye subscore is 4. GCS verbal subscore is 5. GCS motor subscore is 6.  Reflex Scores:      Patellar reflexes are 2+ on the right side and 2+ on the left side. Skin: Skin is warm and dry. No rash noted. He is not diaphoretic. No erythema. No pallor.  Psychiatric: He has a normal mood and affect. His behavior is normal. Judgment and thought content normal.    ED Course  Procedures (including critical care time)  Labs Reviewed  GLUCOSE, CAPILLARY - Abnormal; Notable for the following:    Glucose-Capillary 142 (*)     All other components within normal limits   No results found.   No diagnosis found.    MDM  Pt presented for evaluation of seizure, he is now alert and oriented, NAD.  Mr. Paul Dorsey states he takes lamictal to manage his seizure disorder and has not had any recent changes in his medications.  He denies sleep deprivation or excessive alcohol abuse and adds that his last seizure was about 2 months ago.  He has not had any seizure activity while here in the emergency department.  Plan at this time is to discharge home.        Tobin Chad, MD 02/06/12 [phone]

## 2012-02-06 NOTE — ED Notes (Signed)
Patient reports having a seizure at about 1115. States that he is taking his medication but missed a dose 2 days ago. Denies and pain at this time.

## 2012-04-26 ENCOUNTER — Emergency Department (HOSPITAL_COMMUNITY)
Admission: EM | Admit: 2012-04-26 | Discharge: 2012-04-26 | Disposition: A | Discharge status: Home / Self Care | Payer: No Typology Code available for payment source | Attending: Emergency Medicine | Admitting: Emergency Medicine

## 2012-04-26 ENCOUNTER — Emergency Department (HOSPITAL_COMMUNITY): Payer: No Typology Code available for payment source

## 2012-04-26 ENCOUNTER — Encounter (HOSPITAL_COMMUNITY): Payer: Self-pay

## 2012-04-26 DIAGNOSIS — S0990XA Unspecified injury of head, initial encounter: Secondary | ICD-10-CM | POA: Insufficient documentation

## 2012-04-26 DIAGNOSIS — Y92410 Unspecified street and highway as the place of occurrence of the external cause: Secondary | ICD-10-CM | POA: Insufficient documentation

## 2012-04-26 DIAGNOSIS — S0993XA Unspecified injury of face, initial encounter: Secondary | ICD-10-CM | POA: Insufficient documentation

## 2012-04-26 DIAGNOSIS — Z888 Allergy status to other drugs, medicaments and biological substances status: Secondary | ICD-10-CM | POA: Insufficient documentation

## 2012-04-26 MED ORDER — HYDROCODONE-ACETAMINOPHEN 5-325 MG PO TABS: 2.0000 | ORAL_TABLET | Freq: Four times a day (QID) | ORAL | Status: DC | PRN

## 2012-04-26 MED ORDER — DIAZEPAM 5 MG PO TABS: 5.0000 mg | ORAL_TABLET | Freq: Two times a day (BID) | ORAL | Status: DC
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
## 2012-04-27 NOTE — ED Provider Notes (Signed)
Medical screening examination/treatment/procedure(s) were performed by non-physician practitioner and as supervising physician I was immediately available for consultation/collaboration.   Celene Kras, MD 04/27/12 [phone]

## 2012-06-28 ENCOUNTER — Emergency Department (HOSPITAL_BASED_OUTPATIENT_CLINIC_OR_DEPARTMENT_OTHER)
Admission: EM | Admit: 2012-06-28 | Discharge: 2012-06-28 | Disposition: A | Discharge status: Home / Self Care | Payer: Self-pay | Attending: Emergency Medicine | Admitting: Emergency Medicine

## 2012-06-28 ENCOUNTER — Encounter (HOSPITAL_BASED_OUTPATIENT_CLINIC_OR_DEPARTMENT_OTHER): Payer: Self-pay | Admitting: *Deleted

## 2012-06-28 DIAGNOSIS — Z79899 Other long term (current) drug therapy: Secondary | ICD-10-CM | POA: Insufficient documentation

## 2012-06-28 DIAGNOSIS — G40909 Epilepsy, unspecified, not intractable, without status epilepticus: Secondary | ICD-10-CM | POA: Insufficient documentation

## 2012-06-28 DIAGNOSIS — Z9889 Other specified postprocedural states: Secondary | ICD-10-CM | POA: Insufficient documentation

## 2012-06-28 DIAGNOSIS — IMO0001 Reserved for inherently not codable concepts without codable children: Secondary | ICD-10-CM | POA: Insufficient documentation

## 2012-06-28 DIAGNOSIS — R51 Headache: Secondary | ICD-10-CM | POA: Insufficient documentation

## 2012-06-28 LAB — CBC WITH DIFFERENTIAL/PLATELET
Basophils Relative: 0 % (ref 0–1)
Eosinophils Absolute: 0.1 10*3/uL (ref 0.0–0.7)
Eosinophils Relative: 2 % (ref 0–5)
HCT: 43.7 % (ref 39.0–52.0)
Hemoglobin: 15.6 g/dL (ref 13.0–17.0)
Lymphs Abs: 1.2 10*3/uL (ref 0.7–4.0)
MCH: 31.3 pg (ref 26.0–34.0)
MCHC: 35.7 g/dL (ref 30.0–36.0)
MCV: 87.6 fL (ref 78.0–100.0)
Monocytes Absolute: 0.7 10*3/uL (ref 0.1–1.0)
Monocytes Relative: 11 % (ref 3–12)
RBC: 4.99 MIL/uL (ref 4.22–5.81)

## 2012-06-28 LAB — BASIC METABOLIC PANEL
BUN: 11 mg/dL (ref 6–23)
Calcium: 9.5 mg/dL (ref 8.4–10.5)
Creatinine, Ser: 1.1 mg/dL (ref 0.50–1.35)
GFR calc Af Amer: >90 mL/min (ref >90)
GFR calc non Af Amer: >90 mL/min (ref >90)

## 2012-06-28 MED ORDER — LAMOTRIGINE 200 MG PO TABS
200.0000 mg | ORAL_TABLET | Freq: Once | ORAL | Status: DC
  Filled 2012-06-28: qty 1

## 2012-06-28 MED ORDER — SODIUM CHLORIDE 0.9 % IV BOLUS (SEPSIS)
1000.0000 mL | Freq: Once | INTRAVENOUS | Status: AC
  Administered 2012-06-28: 1000 mL via INTRAVENOUS

## 2012-06-28 MED ORDER — LAMOTRIGINE 100 MG PO TABS: 250.0000 mg | ORAL_TABLET | Freq: Every day | ORAL | Status: DC

## 2012-06-28 NOTE — ED Notes (Signed)
MD at bedside. 

## 2012-06-28 NOTE — ED Provider Notes (Addendum)
History     CSN: [id number]  Arrival date & time 06/28/12  0703   First MD Initiated Contact with Patient 06/28/12 [phone]      Chief Complaint  Patient presents with  . Seizures    (Consider location/radiation/quality/duration/timing/severity/associated sxs/prior treatment) HPI Comments: Pt comes in with cc of seizures. Pt was out playing basketball, and the next thing he recalls is being in the hospital. He admits to having hx of seizures, and missing his doses occasionally. He is prescribed lamictal, he thinks it is 250 mg tid. Denies any recent infections, substance abuse. Pt has slight pain in his lip, has a mild headache and some lower back pain post seizure.  Patient is a 26 y.o. male presenting with seizures. The history is provided by the patient and medical records.  Seizures  Associated symptoms include headaches. Pertinent negatives include no confusion, no visual disturbance, no chest pain and no cough.    Past Medical History  Diagnosis Date  . Seizure   . JME (juvenile myoclonic epilepsy)     Past Surgical History  Procedure Date  . Pyloric stenosis surgery   . Elbow surgery     History reviewed. No pertinent family history.  History  Substance Use Topics  . Smoking status: Never Smoker   . Smokeless tobacco: Not on file  . Alcohol Use: Yes     Comment: occ      Review of Systems  Constitutional: Negative for fever, chills and activity change.  HENT: Negative for neck pain.   Eyes: Negative for visual disturbance.  Respiratory: Negative for cough, chest tightness and shortness of breath.   Cardiovascular: Negative for chest pain.  Gastrointestinal: Negative for abdominal distention.  Genitourinary: Negative for dysuria, enuresis and difficulty urinating.  Musculoskeletal: Positive for myalgias. Negative for arthralgias.  Neurological: Positive for seizures and headaches. Negative for dizziness and light-headedness.  Psychiatric/Behavioral:  Negative for confusion.    Allergies  Clarithromycin  Home Medications   Current Outpatient Rx  Name  Route  Sig  Dispense  Refill  . DIAZEPAM 5 MG PO TABS   Oral   Take 1 tablet (5 mg total) by mouth 2 (two) times daily.   10 tablet   0   . HYDROCODONE-ACETAMINOPHEN 5-325 MG PO TABS   Oral   Take 2 tablets by mouth every 6 (six) hours as needed for pain.   12 tablet   0   . LAMOTRIGINE 100 MG PO TABS   Oral   Take 250 mg by mouth 2 (two) times daily.          Marland Kitchen VITAMIN C 500 MG PO TABS   Oral   Take 1,000 mg by mouth daily.           BP 126/62  Pulse 91  Temp 98.1 F (36.7 C) (Oral)  Resp 14  SpO2 98%  Physical Exam  Nursing note and vitals reviewed. Constitutional: He is oriented to person, place, and time. He appears well-developed and well-nourished.  HENT:  Head: Normocephalic and atraumatic.  Eyes: EOM are normal. Pupils are equal, round, and reactive to light.  Neck: Normal range of motion. Neck supple. No JVD present.  Cardiovascular: Normal rate and regular rhythm.   Pulmonary/Chest: Effort normal and breath sounds normal. No respiratory distress. He has no wheezes.  Abdominal: Soft. Bowel sounds are normal. He exhibits no distension. There is no tenderness. There is no rebound and no guarding.  Neurological: He is alert and oriented to  person, place, and time. No cranial nerve deficit. Coordination normal.  Skin: Skin is warm and dry.    ED Course  Procedures (including critical care time)   Labs Reviewed  CBC WITH DIFFERENTIAL  BASIC METABOLIC PANEL  MAGNESIUM   No results found.   No diagnosis found.    MDM  DDx: -Seizure disorder -Meningitis -Trauma -ICH -Electrolyte abnormality -Metabolic derangement -Stroke -Toxin induced seizures -Medication side effects -Hypoxia -Hypoglycemia  Pt comes in with cc of seizures. Based on hx and exam, this appears to be a seizure due to on compliance to medications. I looked up  Lamictal, and it is a bid dosed med. Pt has not seen a Neurologist on a year, and he is hesitant at seeing one, since each visit costs around $300.  I spoke with Pharmacist - and they too agree that lamictal is bid dosed typically. I see a note while he was inpatient on starting lamictal 200 mg bid. He gets his meds straight through the manufacturer, so no pharmacy to confirm the doses. No specific loading dose per pharmacy.   Derwood Kaplan, MD 06/28/12 0758  8:37 AM discharging with lamictal 250 mg bid. Pt is pretty sure this is his dose.....    Derwood Kaplan, MD 06/28/12 [phone]

## 2012-06-28 NOTE — ED Notes (Signed)
Pt reports generalized headache, states "right behind my eyes". Pt states that he did not take his morning dose of lamichtal this morning, and that he has missed "maybe 4 or 5, I only take it if I feel like I need it.Marland KitchenMarland Kitchen"
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
## 2012-07-10 ENCOUNTER — Emergency Department (HOSPITAL_COMMUNITY)
Admission: EM | Admit: 2012-07-10 | Discharge: 2012-07-10 | Disposition: A | Discharge status: Home / Self Care | Payer: Self-pay | Attending: Emergency Medicine | Admitting: Emergency Medicine

## 2012-07-10 ENCOUNTER — Emergency Department (HOSPITAL_COMMUNITY): Payer: Self-pay

## 2012-07-10 ENCOUNTER — Encounter (HOSPITAL_COMMUNITY): Payer: Self-pay | Admitting: *Deleted

## 2012-07-10 DIAGNOSIS — M25511 Pain in right shoulder: Secondary | ICD-10-CM

## 2012-07-10 DIAGNOSIS — G40309 Generalized idiopathic epilepsy and epileptic syndromes, not intractable, without status epilepticus: Secondary | ICD-10-CM | POA: Insufficient documentation

## 2012-07-10 DIAGNOSIS — M25519 Pain in unspecified shoulder: Secondary | ICD-10-CM | POA: Insufficient documentation

## 2012-07-10 MED ORDER — HYDROCODONE-ACETAMINOPHEN 5-500 MG PO TABS: 1.0000 | ORAL_TABLET | Freq: Four times a day (QID) | ORAL | Status: DC | PRN

## 2012-07-10 NOTE — ED Notes (Signed)
Pt playing basketball 11 days ago and hurt right shoulder when he had a seizure and fell onto floor landing on shoulder; continued pain with movement

## 2012-07-10 NOTE — ED Provider Notes (Signed)
History     CSN: [id number]  Arrival date & time 07/10/12  0401   First MD Initiated Contact with Patient 07/10/12 [phone]      No chief complaint on file.   (Consider location/radiation/quality/duration/timing/severity/associated sxs/prior treatment) HPI  Paul Dorsey is a 26 y.o. male complaining of right shoulder arthralgia status post trauma secondary to seizure 11 days ago. Patient rates the pain as 7/10 and exacerbated by movement. Patient is not taking any antiseizure medications. He has applied for a program with the manufacturer of Lamictal for years worth of medications for free. Patient states that if he is given a prescription for her seizure medication he cannot fill it secondary to financial concerns.  Past Medical History  Diagnosis Date  . Seizure   . JME (juvenile myoclonic epilepsy)     Past Surgical History  Procedure Date  . Pyloric stenosis surgery   . Elbow surgery     No family history on file.  History  Substance Use Topics  . Smoking status: Never Smoker   . Smokeless tobacco: Not on file  . Alcohol Use: Yes     Comment: occ      Review of Systems  Constitutional: Negative for fever.  Respiratory: Negative for shortness of breath.   Cardiovascular: Negative for chest pain.  Gastrointestinal: Negative for nausea, vomiting, abdominal pain and diarrhea.  Musculoskeletal: Positive for arthralgias.  Neurological: Negative for seizures.  All other systems reviewed and are negative.    Allergies  Clarithromycin  Home Medications  No current outpatient prescriptions on file.  BP 135/75  Pulse 86  Temp 98.6 F (37 C) (Oral)  SpO2 100%  Physical Exam  Nursing note and vitals reviewed. Constitutional: He is oriented to person, place, and time. He appears well-developed and well-nourished. No distress.  HENT:  Head: Normocephalic.  Mouth/Throat: Oropharynx is clear and moist. No oropharyngeal exudate.  Eyes: Conjunctivae normal and  EOM are normal.  Cardiovascular: Normal rate.   Pulmonary/Chest: Effort normal and breath sounds normal. No stridor. No respiratory distress. He has no wheezes. He has no rales. He exhibits no tenderness.  Abdominal: Soft. Bowel sounds are normal. He exhibits no distension and no mass. There is no tenderness. There is no rebound and no guarding.  Musculoskeletal: Normal range of motion. He exhibits no edema.       Mildly reduced range of motion to right shoulder. Drop arm is negative. Tenderness to palpation of rotator cuff musculature.  Neurological: He is alert and oriented to person, place, and time.  Psychiatric: He has a normal mood and affect.    ED Course  Procedures (including critical care time)  Labs Reviewed - No data to display Dg Shoulder Right  07/10/2012  *RADIOLOGY REPORT*  Clinical Data: Status post fall; worsening right shoulder pain.  RIGHT SHOULDER - 2+ VIEW  Comparison: None.  Findings: There is no evidence of fracture or dislocation.  The right humeral head is seated within the glenoid fossa.  The acromioclavicular joint is unremarkable in appearance.  No significant soft tissue abnormalities are seen.  The visualized portions of the right lung are clear.  IMPRESSION: No evidence of fracture or dislocation.   Original Report Authenticated By: Tonia Ghent, M.D.      1. Arthralgia of right shoulder region       MDM  X-ray shows no bony abnormalities. Range of motion is excellent. We'll try to control his pain. Had extensive discussion on importance of medication compliance with antiseizure  medications.   Pt verbalized understanding and agrees with care plan. Outpatient follow-up and return precautions given.    New Prescriptions   HYDROCODONE-ACETAMINOPHEN (VICODIN) 5-500 MG PER TABLET    Take 1 tablet by mouth every 6 (six) hours as needed for pain.          Wynetta Emery, PA-C 07/12/12 [phone]

## 2012-07-12 NOTE — ED Provider Notes (Signed)
Medical screening examination/treatment/procedure(s) were performed by non-physician practitioner and as supervising physician I was immediately available for consultation/collaboration.  Olivia Mackie, MD 07/12/12 [phone]

## 2012-10-12 ENCOUNTER — Encounter (HOSPITAL_COMMUNITY): Payer: Self-pay | Admitting: *Deleted

## 2012-10-12 ENCOUNTER — Emergency Department (HOSPITAL_COMMUNITY)
Admission: EM | Admit: 2012-10-12 | Discharge: 2012-10-12 | Disposition: A | Discharge status: Home / Self Care | Payer: Self-pay | Attending: Emergency Medicine | Admitting: Emergency Medicine

## 2012-10-12 DIAGNOSIS — T40605A Adverse effect of unspecified narcotics, initial encounter: Secondary | ICD-10-CM | POA: Insufficient documentation

## 2012-10-12 DIAGNOSIS — R21 Rash and other nonspecific skin eruption: Secondary | ICD-10-CM | POA: Insufficient documentation

## 2012-10-12 DIAGNOSIS — L299 Pruritus, unspecified: Secondary | ICD-10-CM | POA: Insufficient documentation

## 2012-10-12 DIAGNOSIS — T490X5A Adverse effect of local antifungal, anti-infective and anti-inflammatory drugs, initial encounter: Secondary | ICD-10-CM | POA: Insufficient documentation

## 2012-10-12 DIAGNOSIS — G40309 Generalized idiopathic epilepsy and epileptic syndromes, not intractable, without status epilepticus: Secondary | ICD-10-CM | POA: Insufficient documentation

## 2012-10-12 DIAGNOSIS — Z9889 Other specified postprocedural states: Secondary | ICD-10-CM | POA: Insufficient documentation

## 2012-10-12 MED ORDER — DIPHENHYDRAMINE HCL 50 MG/ML IJ SOLN
25.0000 mg | Freq: Once | INTRAMUSCULAR | Status: AC
  Administered 2012-10-12: 25 mg via INTRAMUSCULAR
  Filled 2012-10-12: qty 1

## 2012-10-12 MED ORDER — FAMOTIDINE 20 MG PO TABS: 20.0000 mg | ORAL_TABLET | Freq: Two times a day (BID) | ORAL | Status: DC

## 2012-10-12 MED ORDER — FAMOTIDINE 20 MG PO TABS
20.0000 mg | ORAL_TABLET | Freq: Once | ORAL | Status: AC
  Administered 2012-10-12: 20 mg via ORAL
  Filled 2012-10-12: qty 1

## 2012-10-12 MED ORDER — HYDROCODONE-ACETAMINOPHEN 5-325 MG PO TABS: 1.0000 | ORAL_TABLET | Freq: Four times a day (QID) | ORAL | Status: DC | PRN

## 2012-10-12 MED ORDER — PREDNISONE 20 MG PO TABS
60.0000 mg | ORAL_TABLET | Freq: Once | ORAL | Status: AC
  Administered 2012-10-12: 60 mg via ORAL
  Filled 2012-10-12: qty 3

## 2012-10-12 MED ORDER — PREDNISONE 50 MG PO TABS: 50.0000 mg | ORAL_TABLET | Freq: Every day | ORAL | Status: DC

## 2012-10-12 MED ORDER — ONDANSETRON 8 MG PO TBDP
8.0000 mg | ORAL_TABLET | Freq: Once | ORAL | Status: AC
  Administered 2012-10-12: 8 mg via ORAL
  Filled 2012-10-12: qty 1

## 2012-10-12 NOTE — ED Notes (Signed)
PA at bedside.

## 2012-10-12 NOTE — ED Provider Notes (Signed)
History     CSN: [id number]  Arrival date & time 10/12/12  0302   First MD Initiated Contact with Patient 10/12/12 [phone]      Chief Complaint  Patient presents with  . Allergic Reaction    (Consider location/radiation/quality/duration/timing/severity/associated sxs/prior treatment) HPI Patient presents to the emergency department with rash and right shoulder and arm.  Patient, recent shoulder surgery this past Monday.  Patient, states, that he noticed a rash developing yesterday.  Patient, states he is unsure if he cleaned off all of the Betadine scrub.  Patient does not have any other rashes noted.  Patient, states, that he did not have nausea, fever, chest pain, shortness of breath, wheezing, abdominal pain or headache.  Patient, states, that his itching over his entire body over the last 2 days.  Past Medical History  Diagnosis Date  . Seizure   . JME (juvenile myoclonic epilepsy)     Past Surgical History  Procedure Laterality Date  . Pyloric stenosis surgery    . Elbow surgery      No family history on file.  History  Substance Use Topics  . Smoking status: Never Smoker   . Smokeless tobacco: Not on file  . Alcohol Use: Yes     Comment: occ      Review of Systems All other systems negative except as documented in the HPI. All pertinent positives and negatives as reviewed in the HPI. Allergies  Clarithromycin  Home Medications   Current Outpatient Rx  Name  Route  Sig  Dispense  Refill  . HYDROcodone-acetaminophen (VICODIN) 5-500 MG per tablet   Oral   Take 1 tablet by mouth every 6 (six) hours as needed for pain.   15 tablet   0     BP 164/87  Pulse 90  Temp(Src) 99.2 F (37.3 C)  Resp 20  SpO2 98%  Physical Exam  Nursing note and vitals reviewed. Constitutional: He appears well-developed and well-nourished. No distress.  HENT:  Head: Normocephalic and atraumatic.  Eyes: Pupils are equal, round, and reactive to light.  Neck: Normal range of  motion. Neck supple.  Cardiovascular: Normal rate, regular rhythm and normal heart sounds.   Pulmonary/Chest: Effort normal and breath sounds normal.  Skin: Skin is warm and dry. Rash noted. Rash is not pustular and not urticarial.       ED Course  Procedures (including critical care time) This appears to be a contact type rash.  There is no systemic symptoms other than the itching.  Could be associated with a Percocet side effect.  Patient will be treated for a possible reaction.  Could be associated with the cleansing solution and preoperatively. The patient is in no acute distress at this time. He has no wheezing noted on exam. Patient is handling his secretions. Told to return here as needed.     MDM          Carlyle Dolly, PA-C 10/12/12 [phone]

## 2012-10-12 NOTE — ED Notes (Signed)
Pt had shoulder surgery on Monday; developed rash on inside of right armpit; noted on neck also; vomited 30 mins pta x 2; itching all over at this time; states has been itching x 2 days; started taking oxycodone post op Monday

## 2012-10-17 NOTE — ED Provider Notes (Signed)
Medical screening examination/treatment/procedure(s) were performed by non-physician practitioner and as supervising physician I was immediately available for consultation/collaboration.   Lawson Mahone E Doctor Sheahan, MD 10/17/12 2128 

## 2012-11-07 ENCOUNTER — Ambulatory Visit: Payer: PRIVATE HEALTH INSURANCE | Admitting: Rehabilitation

## 2012-11-13 ENCOUNTER — Ambulatory Visit: Payer: 59 | Attending: Orthopedic Surgery | Admitting: Physical Therapy

## 2012-12-04 ENCOUNTER — Emergency Department (HOSPITAL_BASED_OUTPATIENT_CLINIC_OR_DEPARTMENT_OTHER): Payer: 59

## 2012-12-04 ENCOUNTER — Emergency Department (HOSPITAL_COMMUNITY): Payer: PRIVATE HEALTH INSURANCE

## 2012-12-04 ENCOUNTER — Encounter (HOSPITAL_BASED_OUTPATIENT_CLINIC_OR_DEPARTMENT_OTHER): Payer: Self-pay | Admitting: *Deleted

## 2012-12-04 ENCOUNTER — Emergency Department (HOSPITAL_BASED_OUTPATIENT_CLINIC_OR_DEPARTMENT_OTHER)
Admission: EM | Admit: 2012-12-04 | Discharge: 2012-12-04 | Disposition: A | Discharge status: Home / Self Care | Payer: 59 | Attending: Emergency Medicine | Admitting: Emergency Medicine

## 2012-12-04 ENCOUNTER — Emergency Department (HOSPITAL_BASED_OUTPATIENT_CLINIC_OR_DEPARTMENT_OTHER): Payer: PRIVATE HEALTH INSURANCE

## 2012-12-04 DIAGNOSIS — R4182 Altered mental status, unspecified: Secondary | ICD-10-CM

## 2012-12-04 DIAGNOSIS — IMO0002 Reserved for concepts with insufficient information to code with codable children: Secondary | ICD-10-CM | POA: Insufficient documentation

## 2012-12-04 DIAGNOSIS — Y929 Unspecified place or not applicable: Secondary | ICD-10-CM | POA: Insufficient documentation

## 2012-12-04 DIAGNOSIS — R110 Nausea: Secondary | ICD-10-CM | POA: Insufficient documentation

## 2012-12-04 DIAGNOSIS — X58XXXA Exposure to other specified factors, initial encounter: Secondary | ICD-10-CM | POA: Insufficient documentation

## 2012-12-04 DIAGNOSIS — Y939 Activity, unspecified: Secondary | ICD-10-CM | POA: Insufficient documentation

## 2012-12-04 DIAGNOSIS — R569 Unspecified convulsions: Secondary | ICD-10-CM

## 2012-12-04 LAB — CBC WITH DIFFERENTIAL/PLATELET
Basophils Absolute: 0 10*3/uL (ref 0.0–0.1)
Basophils Relative: 0 % (ref 0–1)
Eosinophils Absolute: 0.2 10*3/uL (ref 0.0–0.7)
Hemoglobin: 16.5 g/dL (ref 13.0–17.0)
MCH: 31.9 pg (ref 26.0–34.0)
MCHC: 36.7 g/dL — ABNORMAL HIGH (ref 30.0–36.0)
Monocytes Relative: 10 % (ref 3–12)
Neutrophils Relative %: 66 % (ref 43–77)
Platelets: 186 10*3/uL (ref 150–400)
RDW: 13 % (ref 11.5–15.5)

## 2012-12-04 LAB — COMPREHENSIVE METABOLIC PANEL
ALT: 42 U/L (ref 0–53)
AST: 27 U/L (ref 0–37)
CO2: 27 mEq/L (ref 19–32)
Chloride: 104 mEq/L (ref 96–112)
Creatinine, Ser: 1.1 mg/dL (ref 0.50–1.35)
GFR calc non Af Amer: >90 mL/min (ref >90)
Total Bilirubin: 0.3 mg/dL (ref 0.3–1.2)

## 2012-12-04 LAB — RAPID URINE DRUG SCREEN, HOSP PERFORMED
Barbiturates: NOT DETECTED
Cocaine: NOT DETECTED

## 2012-12-04 MED ORDER — ONDANSETRON HCL 4 MG/2ML IJ SOLN
4.0000 mg | Freq: Once | INTRAMUSCULAR | Status: AC
  Administered 2012-12-04: 4 mg via INTRAVENOUS
  Filled 2012-12-04: qty 2

## 2012-12-04 MED ORDER — SODIUM CHLORIDE 0.9 % IV BOLUS (SEPSIS)
1000.0000 mL | Freq: Once | INTRAVENOUS | Status: AC
  Administered 2012-12-04: 1000 mL via INTRAVENOUS

## 2012-12-04 NOTE — ED Notes (Signed)
Pt. Reports he does not take meds for seizures and had a seizure today.. Pt. Reports he took himself off lamyctil  A year ago.  Pt. Reports today is 2nd seizure since Dec.  Pt alert and oriented with no distress noted.

## 2012-12-04 NOTE — ED Notes (Signed)
Attempted to call Pt. Brother and no answer.

## 2012-12-04 NOTE — ED Notes (Signed)
EMS reports patient was found lying in the grass outside of his apartment.  States pt was postitical on arrival per ems.  CBG 139.  20G angiocath in left ac per [facility]. Pt is awake, alert and oriented x 3.  States he stopped taking his lamitical 2 months ago due to a hectic work schedule and unable to take his seizure meds on a regular basis.

## 2012-12-04 NOTE — ED Notes (Signed)
Pt is unable to void.  Urinal provided.

## 2012-12-04 NOTE — ED Provider Notes (Signed)
History     CSN: [id number]  Arrival date & time 12/04/12  1031   First MD Initiated Contact with Patient 12/04/12 1041      Chief Complaint  Patient presents with  . Seizures   Level 5- patient amnestic for event (Consider location/radiation/quality/duration/timing/severity/associated sxs/prior treatment) Patient is a 27 y.o. male presenting with seizures. The history is provided by the EMS personnel.  Seizures  Patient with history of seizure disorder since age 32.  Noncompliant with meds as states he has been off schedule.  Patient does not know exactly what happened this am but was seen on ground at his apartment complex and feels like he does after a seizure. He was seen by a passerby who called EMS. EMS reports that he seemed confused upon their initial assessment but has become clearer during  evaluation during transport  patient denies any pain but states he is nauseated which is normal for him after a seizure. He has abrasions on both sides of his head but denies any recent injury.  Patient states normally takes lamictal and has it at home but has not been taking.  He has a local md.   Past Medical History  Diagnosis Date  . Seizure   . JME (juvenile myoclonic epilepsy)     Past Surgical History  Procedure Laterality Date  . Pyloric stenosis surgery    . Elbow surgery    . Shoulder surgery Right     No family history on file.  History  Substance Use Topics  . Smoking status: Never Smoker   . Smokeless tobacco: Not on file  . Alcohol Use: Yes     Comment: occ      Review of Systems  Neurological: Positive for seizures.  All other systems reviewed and are negative.    Allergies  Clarithromycin and Oxycodone  Home Medications   Current Outpatient Rx  Name  Route  Sig  Dispense  Refill  . acetaminophen (TYLENOL) 500 MG tablet   Oral   Take 500 mg by mouth every 6 (six) hours as needed for pain.         . famotidine (PEPCID) 20 MG tablet   Oral  Take 1 tablet (20 mg total) by mouth 2 (two) times daily.   30 tablet   0   . HYDROcodone-acetaminophen (NORCO/VICODIN) 5-325 MG per tablet   Oral   Take 1 tablet by mouth every 6 (six) hours as needed for pain.   15 tablet   0   . oxyCODONE-acetaminophen (PERCOCET/ROXICET) 5-325 MG per tablet   Oral   Take 1 tablet by mouth every 6 (six) hours as needed for pain.         . predniSONE (DELTASONE) 50 MG tablet   Oral   Take 1 tablet (50 mg total) by mouth daily.   5 tablet   0     BP 97/53  Pulse 106  Temp(Src) 98.2 F (36.8 C) (Oral)  Resp 20  SpO2 96%  Physical Exam  Nursing note and vitals reviewed. Constitutional: He is oriented to person, place, and time. He appears well-developed and well-nourished.  HENT:  Head: Normocephalic.  Abrasion bilateral scalp  Eyes: Conjunctivae are normal. Pupils are equal, round, and reactive to light.  Neck: Normal range of motion. Neck supple.  Cardiovascular: Normal rate, regular rhythm, normal heart sounds and intact distal pulses.   Pulmonary/Chest: Effort normal and breath sounds normal.  Abdominal: Soft. Bowel sounds are normal.  Musculoskeletal: Normal range  of motion.  Neurological: He is alert and oriented to person, place, and time. He has normal strength and normal reflexes. A sensory deficit is present. He displays a negative Romberg sign. GCS eye subscore is 4. GCS verbal subscore is 5. GCS motor subscore is 6.  Skin: Skin is warm and dry.  Psychiatric: He has a normal mood and affect. His behavior is normal. Thought content normal.    ED Course  Procedures (including critical care time)  Labs Reviewed  CBC WITH DIFFERENTIAL - Abnormal; Notable for the following:    MCHC 36.7 (*)    All other components within normal limits  COMPREHENSIVE METABOLIC PANEL - Abnormal; Notable for the following:    Potassium 3.4 (*)    Glucose, Bld 100 (*)    Total Protein 5.6 (*)    Albumin 3.4 (*)    All other components  within normal limits  URINE RAPID DRUG SCREEN (HOSP PERFORMED)  ETHANOL   Ct Head Wo Contrast  12/04/2012   *RADIOLOGY REPORT*  Clinical Data:  Seizure, headache, neck pain  CT HEAD WITHOUT CONTRAST CT CERVICAL SPINE WITHOUT CONTRAST  Technique:  Multidetector CT imaging of the head and cervical spine was performed following the standard protocol without intravenous contrast.  Multiplanar CT image reconstructions of the cervical spine were also generated.  Comparison:  CT head dated 04/27/2011.  CT head/cervical spine dated 10/29/2010.  CT HEAD  Findings: No evidence of parenchymal hemorrhage or extra-axial fluid collection. No mass lesion, mass effect, or midline shift.  No CT evidence of acute infarction.  Cerebral volume is age appropriate.  No ventriculomegaly.  The visualized paranasal sinuses are essentially clear. The mastoid air cells are unopacified.  No evidence of calvarial fracture.  IMPRESSION: Normal head CT.  CT CERVICAL SPINE  Findings: Reversal of the normal cervical lordosis.  No evidence of fracture or dislocation.  Vertebral body heights and intervertebral disc spaces are maintained.  Dens appears intact.  No prevertebral soft tissue swelling.  Visualized thyroid is unremarkable.  Visualized lung apices are clear.  IMPRESSION: Normal cervical spine CT.   Original Report Authenticated By: Charline Bills, M.D.   Ct Cervical Spine Wo Contrast  12/04/2012   *RADIOLOGY REPORT*  Clinical Data:  Seizure, headache, neck pain  CT HEAD WITHOUT CONTRAST CT CERVICAL SPINE WITHOUT CONTRAST  Technique:  Multidetector CT imaging of the head and cervical spine was performed following the standard protocol without intravenous contrast.  Multiplanar CT image reconstructions of the cervical spine were also generated.  Comparison:  CT head dated 04/27/2011.  CT head/cervical spine dated 10/29/2010.  CT HEAD  Findings: No evidence of parenchymal hemorrhage or extra-axial fluid collection. No mass lesion,  mass effect, or midline shift.  No CT evidence of acute infarction.  Cerebral volume is age appropriate.  No ventriculomegaly.  The visualized paranasal sinuses are essentially clear. The mastoid air cells are unopacified.  No evidence of calvarial fracture.  IMPRESSION: Normal head CT.  CT CERVICAL SPINE  Findings: Reversal of the normal cervical lordosis.  No evidence of fracture or dislocation.  Vertebral body heights and intervertebral disc spaces are maintained.  Dens appears intact.  No prevertebral soft tissue swelling.  Visualized thyroid is unremarkable.  Visualized lung apices are clear.  IMPRESSION: Normal cervical spine CT.   Original Report Authenticated By: Charline Bills, M.D.     1. Seizure       MDM  Patient with normal exam here.  CT negative.  Patient without seizure  here and remains stable for several hours.  Advised to take his medicine as ordered. Plan keppra load and restart lamictal.       Hilario Quarry, MD 12/04/12 1450

## 2013-11-03 ENCOUNTER — Emergency Department (HOSPITAL_COMMUNITY)
Admission: EM | Admit: 2013-11-03 | Discharge: 2013-11-03 | Disposition: A | Discharge status: Home / Self Care | Payer: No Typology Code available for payment source | Attending: Emergency Medicine | Admitting: Emergency Medicine

## 2013-11-03 ENCOUNTER — Encounter (HOSPITAL_COMMUNITY): Payer: Self-pay | Admitting: Emergency Medicine

## 2013-11-03 DIAGNOSIS — Z79899 Other long term (current) drug therapy: Secondary | ICD-10-CM | POA: Insufficient documentation

## 2013-11-03 DIAGNOSIS — R569 Unspecified convulsions: Secondary | ICD-10-CM

## 2013-11-03 DIAGNOSIS — G40309 Generalized idiopathic epilepsy and epileptic syndromes, not intractable, without status epilepticus: Secondary | ICD-10-CM | POA: Insufficient documentation

## 2013-11-03 LAB — I-STAT CHEM 8, ED
BUN: 13 mg/dL (ref 6–23)
CALCIUM ION: 1.17 mmol/L (ref 1.12–1.23)
CHLORIDE: 104 meq/L (ref 96–112)
Creatinine, Ser: 1.2 mg/dL (ref 0.50–1.35)
Glucose, Bld: 97 mg/dL (ref 70–99)
HEMATOCRIT: 47 % (ref 39.0–52.0)
Hemoglobin: 16 g/dL (ref 13.0–17.0)
POTASSIUM: 4.6 meq/L (ref 3.7–5.3)
Sodium: 142 mEq/L (ref 137–147)
TCO2: 24 mmol/L (ref 0–100)

## 2013-11-03 NOTE — ED Notes (Signed)
Bed: WA04 Expected date:  Expected time:  Means of arrival:  Comments: EMS - seziure

## 2013-11-03 NOTE — Discharge Instructions (Signed)
Driving and Equipment Restrictions °Some medical problems make it dangerous to drive, ride a bike, or use machines. Some of these problems are: °· A hard blow to the head (concussion). °· Passing out (fainting). °· Twitching and shaking (seizures). °· Low blood sugar. °· Taking medicine to help you relax (sedatives). °· Taking pain medicines. °· Wearing an eye patch. °· Wearing splints. This can make it hard to use parts of your body that you need to drive safely. °HOME CARE  °· Do not drive until your doctor says it is okay. °· Do not use machines until your doctor says it is okay. °You may need a form signed by your doctor (medical release) before you can drive again. You may also need this form before you do other tasks where you need to be fully alert. °MAKE SURE YOU: °· Understand these instructions. °· Will watch your condition. °· Will get help right away if you are not doing well or get worse. °Document Released: 08/04/2004 Document Revised: 09/19/2011 Document Reviewed: 11/04/2009 °ExitCare® Patient Information ©2014 [employer]. ° °Epilepsy °People with epilepsy have times when they shake and jerk uncontrollably (seizures). This happens when there is a sudden change in brain function. Epilepsy may have many possible causes. Anything that disturbs the normal pattern of brain cell activity can lead to seizures. °HOME CARE  °· Follow your doctor's instructions about driving and safety during normal activities. °· Get enough sleep. °· Only take medicine as told by your doctor. °· Avoid things that you know can cause you to have seizures (triggers). °· Write down when your seizures happen and what you remember about each seizure. Write down anything you think may have caused the seizure to happen. °· Tell the people you live and work with that you have seizures. Make sure they know how to help you. They should: °· Cushion your head and body. °· Turn you on your side. °· Not restrain you. °· Not place anything  inside your mouth. °· Call for local emergency medical help if there is any question about what has happened. °· Keep all follow-up visits with your doctor. This is very important. °GET HELP IF: °· You get an infection or start to feel sick. You may have more seizures when you are sick. °· You are having seizures more often. °· Your seizure pattern is changing. °GET HELP RIGHT AWAY IF:  °· A seizure does not stop after a few seconds or minutes. °· A seizure causes you to have trouble breathing. °· A seizure gives you a very bad headache. °· A seizure makes you unable to speak or use a part of your body. °Document Released: 04/24/2009 Document Revised: 04/17/2013 Document Reviewed: 02/06/2013 °ExitCare® Patient Information ©2014 [employer]. ° °

## 2013-11-03 NOTE — ED Provider Notes (Signed)
CSN: [id number]     Arrival date & time 11/03/13  1206 History   First MD Initiated Contact with Patient 11/03/13 1207     Chief Complaint  Patient presents with  . 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  He had a witnessed tonic-clonic seizure.  Was incontinent of urine.  Has history of epilepsy.  Is currently taking Lamictal twice a day.  Patient denies missing any recent dosages.  Patient had possibly had hours of sleep last night and drinks no alcohol.  Patient states there was a lot of stress at work today.  Patient is back to baseline currently in denies any pain or injury. Past Medical History  Diagnosis Date  . Seizure   . JME (juvenile myoclonic epilepsy)    Past Surgical History  Procedure Laterality Date  . Pyloric stenosis surgery    . Elbow surgery    . Shoulder surgery Right    History reviewed. No pertinent family history. History  Substance Use Topics  . Smoking status: Never Smoker   . Smokeless tobacco: Not on file  . Alcohol Use: Yes     Comment: occ    Review of Systems  All other systems reviewed and are negative.     Allergies  Clarithromycin and Oxycodone  Home Medications   Prior to Admission medications   Medication Sig Start Date End Date Taking? Authorizing Provider  lamoTRIgine (LAMICTAL) 100 MG tablet Take 50 mg by mouth daily.   Yes Historical Provider, MD  lamoTRIgine (LAMICTAL) 200 MG tablet Take 200 mg by mouth daily.   Yes Historical Provider, MD   BP 110/54  Pulse 98  Temp(Src) 98.9 F (37.2 C)  Resp 20  SpO2 93% Physical Exam  Nursing note and vitals reviewed. Constitutional: He is oriented to person, place, and time. He appears well-developed and well-nourished. No distress.  HENT:  Head: Normocephalic and atraumatic.  Eyes: Pupils are equal, round, and reactive to light.  Neck: Normal range of motion.  Cardiovascular: Normal rate and intact distal pulses.   Pulmonary/Chest: No respiratory  distress.  Abdominal: Normal appearance. He exhibits no distension.  Musculoskeletal: Normal range of motion.  Neurological: He is alert and oriented to person, place, and time. No cranial nerve deficit or sensory deficit. GCS eye subscore is 4. GCS verbal subscore is 5. GCS motor subscore is 6.  Skin: Skin is warm and dry. No rash noted.  Psychiatric: He has a normal mood and affect. His behavior is normal.    ED Course  Procedures (including critical care time) Labs Review Labs Reviewed  I-STAT CHEM 8, ED    Imaging Review No results found.      MDM   Final diagnoses:  Seizures        Nelia Shiobert L Demario Faniel, MD 11/03/13 1344

## 2013-11-03 NOTE — ED Notes (Signed)
Per EMS-pt here with c/o of seizure witness, incontinent, hx of epilepsy. Started taking Lamictal a month ago. Currently AAO

## 2013-12-29 ENCOUNTER — Emergency Department (HOSPITAL_COMMUNITY)
Admission: EM | Admit: 2013-12-29 | Discharge: 2013-12-29 | Disposition: A | Discharge status: Home / Self Care | Payer: No Typology Code available for payment source | Attending: Emergency Medicine | Admitting: Emergency Medicine

## 2013-12-29 ENCOUNTER — Encounter (HOSPITAL_COMMUNITY): Payer: Self-pay | Admitting: Emergency Medicine

## 2013-12-29 DIAGNOSIS — Z79899 Other long term (current) drug therapy: Secondary | ICD-10-CM | POA: Insufficient documentation

## 2013-12-29 DIAGNOSIS — R569 Unspecified convulsions: Secondary | ICD-10-CM

## 2013-12-29 DIAGNOSIS — G40909 Epilepsy, unspecified, not intractable, without status epilepticus: Secondary | ICD-10-CM | POA: Insufficient documentation

## 2013-12-29 LAB — CBC
HEMATOCRIT: 44 % (ref 39.0–52.0)
HEMOGLOBIN: 15.1 g/dL (ref 13.0–17.0)
MCH: 30.9 pg (ref 26.0–34.0)
MCHC: 34.3 g/dL (ref 30.0–36.0)
MCV: 90.2 fL (ref 78.0–100.0)
Platelets: 227 10*3/uL (ref 150–400)
RBC: 4.88 MIL/uL (ref 4.22–5.81)
RDW: 12.7 % (ref 11.5–15.5)
WBC: 4.6 10*3/uL (ref 4.0–10.5)

## 2013-12-29 LAB — URINALYSIS, ROUTINE W REFLEX MICROSCOPIC
Bilirubin Urine: NEGATIVE
GLUCOSE, UA: NEGATIVE mg/dL
HGB URINE DIPSTICK: NEGATIVE
KETONES UR: NEGATIVE mg/dL
Leukocytes, UA: NEGATIVE
Nitrite: NEGATIVE
Protein, ur: NEGATIVE mg/dL
Specific Gravity, Urine: 1.018 (ref 1.005–1.030)
Urobilinogen, UA: 0.2 mg/dL (ref 0.0–1.0)
pH: 5 (ref 5.0–8.0)

## 2013-12-29 LAB — BASIC METABOLIC PANEL
BUN: 13 mg/dL (ref 6–23)
CALCIUM: 9.3 mg/dL (ref 8.4–10.5)
CO2: 27 meq/L (ref 19–32)
CREATININE: 1.08 mg/dL (ref 0.50–1.35)
Chloride: 100 mEq/L (ref 96–112)
GFR calc Af Amer: >90 mL/min (ref >90)
GFR calc non Af Amer: >90 mL/min (ref >90)
GLUCOSE: 133 mg/dL — AB (ref 70–99)
Potassium: 4.1 mEq/L (ref 3.7–5.3)
Sodium: 139 mEq/L (ref 137–147)

## 2013-12-29 LAB — RAPID URINE DRUG SCREEN, HOSP PERFORMED
Amphetamines: NOT DETECTED
Barbiturates: NOT DETECTED
Benzodiazepines: NOT DETECTED
Cocaine: NOT DETECTED
OPIATES: NOT DETECTED
Tetrahydrocannabinol: NOT DETECTED

## 2013-12-29 LAB — ETHANOL: Alcohol, Ethyl (B): <11 mg/dL (ref 0–11)

## 2013-12-29 MED ORDER — ACETAMINOPHEN 500 MG PO TABS
1000.0000 mg | ORAL_TABLET | Freq: Once | ORAL | Status: AC
  Administered 2013-12-29: 1000 mg via ORAL
  Filled 2013-12-29: qty 2

## 2013-12-29 NOTE — ED Notes (Signed)
Bed: WA04 Expected date:  Expected time:  Means of arrival:  Comments: EMS seizure 

## 2013-12-29 NOTE — ED Notes (Addendum)
Per EMS. Pt from work, had unwitnessed seizure in Engineer, structuralelevator. Was postictal on EMS arrival for 10 minutes. Vomited 2x. Last seizure a month ago, today's seizure was same as past seizures. Pt on lamictal for seizures, has been taking consistently.

## 2013-12-29 NOTE — ED Notes (Signed)
Pt is aware that urine is needed for a sample.  

## 2013-12-29 NOTE — ED Provider Notes (Signed)
TIME SEEN: 10:15 AM  CHIEF COMPLAINT: Unwitnessed seizure  HPI: Patient is a 28 year old male with a history of epilepsy since he was 28 years old on Lamictal twice daily who presents the emergency department with a seizure at work today. Patient states he was feeling well when he went to work and he was on the elevator when he must have had a seizure. On the elevator up and his coworkers found him on the ground. There is no incontinence or tongue biting. He did have 2 episodes of vomiting is complaining of a diffuse, throbbing headache which is typical for him after his seizures. Denies any recent fevers, cough, diarrhea, numbness or focal weakness, headache injury. No drug or alcohol use. He states he's been taking his Lamictal as prescribed and has not missed any doses. He does state that he has not been sleeping well and has had increased stress recently. His last seizure was 10/2613.Marland Kitchen. [facility] [place] [facility] [place] [place] [facility]. He is not currently driving.  ROS: See HPI Constitutional: no fever  Eyes: no drainage  ENT: no runny nose   Cardiovascular:  no chest pain  Resp: no SOB  GI: no vomiting GU: no dysuria Integumentary: no rash  Allergy: no hives  Musculoskeletal: no leg swelling  Neurological: no slurred speech ROS otherwise negative  PAST MEDICAL HISTORY/PAST SURGICAL HISTORY:  Past Medical History  Diagnosis Date  . Seizure   . JME (juvenile myoclonic epilepsy)     MEDICATIONS:  Prior to Admission medications   Medication Sig Start Date End Date Taking? Authorizing Provider  lamoTRIgine (LAMICTAL) 100 MG tablet Take 50 mg by mouth daily. Patient total dose=250 MG.    Historical Provider, MD  lamoTRIgine (LAMICTAL) 200 MG tablet Take 200 mg by mouth daily. Patient total dose=250 MG.    Historical Provider, MD    ALLERGIES:  Allergies  Allergen Reactions  . Clarithromycin Nausea And Vomiting  . Oxycodone Itching and Rash    SOCIAL HISTORY:  History   Substance Use Topics  . Smoking status: Never Smoker   . Smokeless tobacco: Not on file  . Alcohol Use: Yes     Comment: occ    FAMILY HISTORY: No family history on file.  EXAM: BP 119/61  Pulse 98  Temp(Src) 98.3 F (36.8 C) (Oral)  Resp 16  SpO2 94% CONSTITUTIONAL: Alert and oriented and responds appropriately to questions. Well-appearing; well-nourished HEAD: Normocephalic EYES: Conjunctivae clear, PERRL ENT: normal nose; no rhinorrhea; moist mucous membranes; pharynx without lesions noted NECK: Supple, no meningismus, no LAD; no midline spinal tenderness or step-off or deformity CARD: RRR; S1 and S2 appreciated; no murmurs, no clicks, no rubs, no gallops RESP: Normal chest excursion without splinting or tachypnea; breath sounds clear and equal bilaterally; no wheezes, no rhonchi, no rales,  ABD/GI: Normal bowel sounds; non-distended; soft, non-tender, no rebound, no guarding BACK:  The back appears normal and is non-tender to palpation, there is no CVA tenderness, no midline spinal tenderness or step-off or deformity EXT: Normal ROM in all joints; non-tender to palpation; no edema; normal capillary refill; no cyanosis    SKIN: Normal color for age and race; warm NEURO: Moves all extremities equally, sensation to light touch intact diffusely, cranial nerves II through XII intact, strength 5/5 in all 4 extremities PSYCH: The patient's mood and manner are appropriate. Grooming and personal hygiene are appropriate.  MEDICAL DECISION MAKING: Patient here with a seizure today. He is now neurologically intact, hemodynamically  stable with no signs of trauma on exam. Will obtain basic labs and urine to rule out organic cause for his seizure today but suspect that his seizure was caused by decreased sleep and increased stress that he reports he has had recently. Have advised patient to call his neurologist tomorrow morning as he will likely need close followup given this is his second  seizure in 2 months.  ED PROGRESS: Labs and urine are unremarkable. Patient is feeling well, still neurologically intact. We'll discharge home. We'll have him continue his omental and call his neurologist tomorrow morning. He is comfortable with this plan. Discussed seizure return precautions and supportive care instructions. Patient verbalizes understanding and is comfortable with plan.     Layla MawKristen N Ward, DO 12/29/13 1258

## 2013-12-29 NOTE — Discharge Instructions (Signed)
Driving and Equipment Restrictions °Some medical problems make it dangerous to drive, ride a bike, or use machines. Some of these problems are: °· A hard blow to the head (concussion). °· Passing out (fainting). °· Twitching and shaking (seizures). °· Low blood sugar. °· Taking medicine to help you relax (sedatives). °· Taking pain medicines. °· Wearing an eye patch. °· Wearing splints. This can make it hard to use parts of your body that you need to drive safely. °HOME CARE  °· Do not drive until your doctor says it is okay. °· Do not use machines until your doctor says it is okay. °You may need a form signed by your doctor (medical release) before you can drive again. You may also need this form before you do other tasks where you need to be fully alert. °MAKE SURE YOU: °· Understand these instructions. °· Will watch your condition. °· Will get help right away if you are not doing well or get worse. °Document Released: 08/04/2004 Document Revised: 09/19/2011 Document Reviewed: 11/04/2009 °ExitCare® Patient Information ©2015 [employer]. This information is not intended to replace advice given to you by your health care provider. Make sure you discuss any questions you have with your health care provider. ° °Epilepsy °Epilepsy is a disorder in which a person has repeated seizures over time. A seizure is a release of abnormal electrical activity in the brain. Seizures can cause a change in attention, behavior, or the ability to remain awake and alert (altered mental status). Seizures often involve uncontrollable shaking (convulsions).  °Most people with epilepsy lead normal lives. However, people with epilepsy are at an increased risk of falls, accidents, and injuries. Therefore, it is important to begin treatment right away. °CAUSES  °Epilepsy has many possible causes. Anything that disturbs the normal pattern of brain cell activity can lead to seizures. This may include:  °· Head injury. °· Birth trauma. °· High  fever as a child. °· Stroke. °· Bleeding into or around the brain. °· Certain drugs. °· Prolonged low oxygen, such as what occurs after CPR efforts. °· Abnormal brain development. °· Certain illnesses, such as meningitis, encephalitis (brain infection), malaria, and other infections. °· An imbalance of nerve signaling chemicals (neurotransmitters).   °SIGNS AND SYMPTOMS  °The symptoms of a seizure can vary greatly from one person to another. Right before a seizure, you may have a warning (aura) that a seizure is about to occur. An aura may include the following symptoms: °· Fear or anxiety. °· Nausea. °· Feeling like the room is spinning (vertigo). °· Vision changes, such as seeing flashing lights or spots. °Common symptoms during a seizure include: °· Abnormal sensations, such as an abnormal smell or a bitter taste in the mouth.   °· Sudden, general body stiffness.   °· Convulsions that involve rhythmic jerking of the face, arm, or leg on one or both sides.   °· Sudden change in consciousness.   °¨ Appearing to be awake but not responding.   °¨ Appearing to be asleep but cannot be awakened.   °· Grimacing, chewing, lip smacking, drooling, tongue biting, or loss of bowel or bladder control. °After a seizure, you may feel sleepy for a while.  °DIAGNOSIS  °Your health care provider will ask about your symptoms and take a medical history. Descriptions from any witnesses to your seizures will be very helpful in the diagnosis. A physical exam, including a detailed neurological exam, is necessary. Various tests may be done, such as:  °· An electroencephalogram (EEG). This is a painless test   of your brain waves. In this test, a diagram is created of your brain waves. These diagrams can be interpreted by a specialist. °· An MRI of the brain.   °· A CT scan of the brain.   °· A spinal tap (lumbar puncture, LP). °· Blood tests to check for signs of infection or abnormal blood chemistry. °TREATMENT  °There is no cure for  epilepsy, but it is generally treatable. Once epilepsy is diagnosed, it is important to begin treatment as soon as possible. For most people with epilepsy, seizures can be controlled with medicines. The following may also be used: °· A pacemaker for the brain (vagus nerve stimulator) can be used for people with seizures that are not well controlled by medicine. °· Surgery on the brain. °For some people, epilepsy eventually goes away. °HOME CARE INSTRUCTIONS  °· Follow your health care provider's recommendations on driving and safety in normal activities. °· Get enough rest. Lack of sleep can cause seizures. °· Only take over-the-counter or prescription medicines as directed by your health care provider. Take any prescribed medicine exactly as directed. °· Avoid any known triggers of your seizures. °· Keep a seizure diary. Record what you recall about any seizure, especially any possible trigger.   °· Make sure the people you live and work with know that you are prone to seizures. They should receive instructions on how to help you. In general, a witness to a seizure should:   °¨ Cushion your head and body.   °¨ Turn you on your side.   °¨ Avoid unnecessarily restraining you.   °¨ Not place anything inside your mouth.   °¨ Call for emergency medical help if there is any question about what has occurred.   °· Follow up with your health care provider as directed. You may need regular blood tests to monitor the levels of your medicine.   °SEEK MEDICAL CARE IF:  °· You develop signs of infection or other illness. This might increase the risk of a seizure.   °· You seem to be having more frequent seizures.   °· Your seizure pattern is changing.   °SEEK IMMEDIATE MEDICAL CARE IF:  °· You have a seizure that does not stop after a few moments.   °· You have a seizure that causes any difficulty in breathing.   °· You have a seizure that results in a very severe headache.   °· You have a seizure that leaves you with the  inability to speak or use a part of your body.   °Document Released: 06/27/2005 Document Revised: 04/17/2013 Document Reviewed: 02/06/2013 °ExitCare® Patient Information ©2015 [employer]. This information is not intended to replace advice given to you by your health care provider. Make sure you discuss any questions you have with your health care provider. ° °

## 2013-12-30 LAB — CBG MONITORING, ED: GLUCOSE-CAPILLARY: 114 mg/dL — AB (ref 70–99)

## 2014-01-28 ENCOUNTER — Emergency Department (HOSPITAL_COMMUNITY)
Admission: EM | Admit: 2014-01-28 | Discharge: 2014-01-28 | Disposition: A | Discharge status: Home / Self Care | Payer: No Typology Code available for payment source | Attending: Emergency Medicine | Admitting: Emergency Medicine

## 2014-01-28 ENCOUNTER — Encounter (HOSPITAL_COMMUNITY): Payer: Self-pay | Admitting: Emergency Medicine

## 2014-01-28 DIAGNOSIS — G40909 Epilepsy, unspecified, not intractable, without status epilepticus: Secondary | ICD-10-CM | POA: Insufficient documentation

## 2014-01-28 DIAGNOSIS — E875 Hyperkalemia: Secondary | ICD-10-CM | POA: Insufficient documentation

## 2014-01-28 DIAGNOSIS — R569 Unspecified convulsions: Secondary | ICD-10-CM

## 2014-01-28 LAB — RAPID URINE DRUG SCREEN, HOSP PERFORMED
AMPHETAMINES: NOT DETECTED
BARBITURATES: NOT DETECTED
Benzodiazepines: NOT DETECTED
Cocaine: NOT DETECTED
Opiates: NOT DETECTED
TETRAHYDROCANNABINOL: NOT DETECTED

## 2014-01-28 LAB — URINALYSIS, ROUTINE W REFLEX MICROSCOPIC
BILIRUBIN URINE: NEGATIVE
Glucose, UA: NEGATIVE mg/dL
HGB URINE DIPSTICK: NEGATIVE
KETONES UR: NEGATIVE mg/dL
Leukocytes, UA: NEGATIVE
Nitrite: NEGATIVE
PROTEIN: NEGATIVE mg/dL
Specific Gravity, Urine: 1.016 (ref 1.005–1.030)
Urobilinogen, UA: 0.2 mg/dL (ref 0.0–1.0)
pH: 5.5 (ref 5.0–8.0)

## 2014-01-28 LAB — CBC WITH DIFFERENTIAL/PLATELET
BASOS PCT: 0 % (ref 0–1)
Basophils Absolute: 0 10*3/uL (ref 0.0–0.1)
EOS ABS: 0 10*3/uL (ref 0.0–0.7)
Eosinophils Relative: 0 % (ref 0–5)
HCT: 46.6 % (ref 39.0–52.0)
HEMOGLOBIN: 16 g/dL (ref 13.0–17.0)
Lymphocytes Relative: 17 % (ref 12–46)
Lymphs Abs: 0.9 10*3/uL (ref 0.7–4.0)
MCH: 31.7 pg (ref 26.0–34.0)
MCHC: 34.3 g/dL (ref 30.0–36.0)
MCV: 92.3 fL (ref 78.0–100.0)
MONO ABS: 0.5 10*3/uL (ref 0.1–1.0)
Monocytes Relative: 10 % (ref 3–12)
NEUTROS PCT: 73 % (ref 43–77)
Neutro Abs: 3.9 10*3/uL (ref 1.7–7.7)
Platelets: 221 10*3/uL (ref 150–400)
RBC: 5.05 MIL/uL (ref 4.22–5.81)
RDW: 12.8 % (ref 11.5–15.5)
WBC: 5.3 10*3/uL (ref 4.0–10.5)

## 2014-01-28 LAB — BASIC METABOLIC PANEL
Anion gap: 14 (ref 5–15)
BUN: 13 mg/dL (ref 6–23)
CALCIUM: 9.8 mg/dL (ref 8.4–10.5)
CO2: 27 mEq/L (ref 19–32)
CREATININE: 1.22 mg/dL (ref 0.50–1.35)
Chloride: 104 mEq/L (ref 96–112)
GFR, EST NON AFRICAN AMERICAN: 81 mL/min — AB (ref >90)
Glucose, Bld: 102 mg/dL — ABNORMAL HIGH (ref 70–99)
POTASSIUM: 5.5 meq/L — AB (ref 3.7–5.3)
Sodium: 145 mEq/L (ref 137–147)

## 2014-01-28 LAB — CBG MONITORING, ED: Glucose-Capillary: 92 mg/dL (ref 70–99)

## 2014-01-28 MED ORDER — LACOSAMIDE 50 MG PO TABS: 50.0000 mg | ORAL_TABLET | Freq: Two times a day (BID) | ORAL | Status: DC

## 2014-01-28 MED ORDER — SODIUM CHLORIDE 0.9 % IV BOLUS (SEPSIS)
1000.0000 mL | Freq: Once | INTRAVENOUS | Status: AC
  Administered 2014-01-28: 1000 mL via INTRAVENOUS

## 2014-01-28 MED ORDER — LACOSAMIDE 50 MG PO TABS
50.0000 mg | ORAL_TABLET | Freq: Once | ORAL | Status: AC
  Administered 2014-01-28: 50 mg via ORAL
  Filled 2014-01-28: qty 1

## 2014-01-28 NOTE — ED Notes (Signed)
Pt was at work today and had 3 witnessed seizures approx 1 min each, approx 5 mins apart.  Reported that he didn't regain consciousness in-between.  On EMS arrival, pt was post-ictal and placed on O2.  Pt reports being compliant w/meds to ems when he did wake up but stated he had shoulder surgery in recent past and didn't get much sleep b/c of pain.  EMS unable to get IV access.  No other injuries or complaints.

## 2014-01-28 NOTE — Progress Notes (Signed)
  CARE MANAGEMENT ED NOTE 01/28/2014  Patient:  Paul HatchNGRAM,Biff T   Account Number:  [id number]  Date Initiated:  01/28/2014  Documentation initiated by:  Edd ArbourGIBBS,Briton Sellman  Subjective/Objective Assessment:   28 yr old [place] pt [facility] ) no pcp listed c/o seizure     Subjective/Objective Assessment Detail:   pt states pcp is Dr Leavy CellaBoyd  Neurologist is Dr Anne HahnWillis     Action/Plan:   ED Cm spoke with pt Inquired about pcp updated EPIC   Action/Plan Detail:   Anticipated DC Date:       Status Recommendation to Physician:   Result of Recommendation:    Other ED Services  Consult Working Plan    DC Planning Services  Other  PCP issues  Outpatient Services - Pt will follow up    Choice offered to / List presented to:            Status of service:  Completed, signed off  ED Comments:   ED Comments Detail:

## 2014-01-28 NOTE — Discharge Instructions (Signed)
I discussed your care with Dr. Anne Hahn. She recommends adding Vimpat 50 mg twice daily for seizure control. Please continue taking your Lamictal as prescribed.    Epilepsy Epilepsy is a disorder in which a person has repeated seizures over time. A seizure is a release of abnormal electrical activity in the brain. Seizures can cause a change in attention, behavior, or the ability to remain awake and alert (altered mental status). Seizures often involve uncontrollable shaking (convulsions).  Most people with epilepsy lead normal lives. However, people with epilepsy are at an increased risk of falls, accidents, and injuries. Therefore, it is important to begin treatment right away. CAUSES  Epilepsy has many possible causes. Anything that disturbs the normal pattern of brain cell activity can lead to seizures. This may include:   Head injury.  Birth trauma.  High fever as a child.  Stroke.  Bleeding into or around the brain.  Certain drugs.  Prolonged low oxygen, such as what occurs after CPR efforts.  Abnormal brain development.  Certain illnesses, such as meningitis, encephalitis (brain infection), malaria, and other infections.  An imbalance of nerve signaling chemicals (neurotransmitters).  SIGNS AND SYMPTOMS  The symptoms of a seizure can vary greatly from one person to another. Right before a seizure, you may have a warning (aura) that a seizure is about to occur. An aura may include the following symptoms:  Fear or anxiety.  Nausea.  Feeling like the room is spinning (vertigo).  Vision changes, such as seeing flashing lights or spots. Common symptoms during a seizure include:  Abnormal sensations, such as an abnormal smell or a bitter taste in the mouth.   Sudden, general body stiffness.   Convulsions that involve rhythmic jerking of the face, arm, or leg on one or both sides.   Sudden change in consciousness.   Appearing to be awake but not responding.    Appearing to be asleep but cannot be awakened.   Grimacing, chewing, lip smacking, drooling, tongue biting, or loss of bowel or bladder control. After a seizure, you may feel sleepy for a while. DIAGNOSIS  Your health care provider will ask about your symptoms and take a medical history. Descriptions from any witnesses to your seizures will be very helpful in the diagnosis. A physical exam, including a detailed neurological exam, is necessary. Various tests may be done, such as:   An electroencephalogram (EEG). This is a painless test of your brain waves. In this test, a diagram is created of your brain waves. These diagrams can be interpreted by a specialist.  An MRI of the brain.   A CT scan of the brain.   A spinal tap (lumbar puncture, LP).  Blood tests to check for signs of infection or abnormal blood chemistry. TREATMENT  There is no cure for epilepsy, but it is generally treatable. Once epilepsy is diagnosed, it is important to begin treatment as soon as possible. For most people with epilepsy, seizures can be controlled with medicines. The following may also be used:  A pacemaker for the brain (vagus nerve stimulator) can be used for people with seizures that are not well controlled by medicine.  Surgery on the brain. For some people, epilepsy eventually goes away. HOME CARE INSTRUCTIONS   Follow your health care provider's recommendations on driving and safety in normal activities.  Get enough rest. Lack of sleep can cause seizures.  Only take over-the-counter or prescription medicines as directed by your health care provider. Take any prescribed medicine exactly as  directed.  Avoid any known triggers of your seizures.  Keep a seizure diary. Record what you recall about any seizure, especially any possible trigger.   Make sure the people you live and work with know that you are prone to seizures. They should receive instructions on how to help you. In general, a  witness to a seizure should:   Cushion your head and body.   Turn you on your side.   Avoid unnecessarily restraining you.   Not place anything inside your mouth.   Call for emergency medical help if there is any question about what has occurred.   Follow up with your health care provider as directed. You may need regular blood tests to monitor the levels of your medicine.  SEEK MEDICAL CARE IF:   You develop signs of infection or other illness. This might increase the risk of a seizure.   You seem to be having more frequent seizures.   Your seizure pattern is changing.  SEEK IMMEDIATE MEDICAL CARE IF:   You have a seizure that does not stop after a few moments.   You have a seizure that causes any difficulty in breathing.   You have a seizure that results in a very severe headache.   You have a seizure that leaves you with the inability to speak or use a part of your body.  Document Released: 06/27/2005 Document Revised: 04/17/2013 Document Reviewed: 02/06/2013 [facility] Patient Information 2015 [place], [place]. This information is not intended to replace advice given to you by your health care provider. Make sure you discuss any questions you have with your health care provider.  Driving and Equipment Restrictions Some medical problems make it dangerous to drive, ride a bike, or use machines. Some of these problems are:  A hard blow to the head (concussion).  Passing out (fainting).  Twitching and shaking (seizures).  Low blood sugar.  Taking medicine to help you relax (sedatives).  Taking pain medicines.  Wearing an eye patch.  Wearing splints. This can make it hard to use parts of your body that you need to drive safely. HOME CARE   Do not drive until your doctor says it is okay.  Do not use machines until your doctor says it is okay. You may need a form signed by your doctor (medical release) before you can drive again. You may also need this  form before you do other tasks where you need to be fully alert. MAKE SURE YOU:  Understand these instructions.  Will watch your condition.  Will get help right away if you are not doing well or get worse. Document Released: 08/04/2004 Document Revised: 09/19/2011 Document Reviewed: 11/04/2009 [facility] Patient Information 2015 [place], [place]. This information is not intended to replace advice given to you by your health care provider. Make sure you discuss any questions you have with your health care provider.  Driving and Equipment Restrictions Some medical problems make it dangerous to drive, ride a bike, or use machines. Some of these problems are:  A hard blow to the head (concussion).  Passing out (fainting).  Twitching and shaking (seizures).  Low blood sugar.  Taking medicine to help you relax (sedatives).  Taking pain medicines.  Wearing an eye patch.  Wearing splints. This can make it hard to use parts of your body that you need to drive safely. HOME CARE   Do not drive until your doctor says it is okay.  Do not use machines until your doctor says it  is okay. You may need a form signed by your doctor (medical release) before you can drive again. You may also need this form before you do other tasks where you need to be fully alert. MAKE SURE YOU:  Understand these instructions.  Will watch your condition.  Will get help right away if you are not doing well or get worse. Document Released: 08/04/2004 Document Revised: 09/19/2011 Document Reviewed: 11/04/2009 [facility] Patient Information 2015 [place]. This information is not intended to replace advice given to you by your health care provider. Make sure you discuss any questions you have with your health care provider.

## 2014-01-28 NOTE — ED Provider Notes (Signed)
TIME SEEN: 2:41 PM  CHIEF COMPLAINT: Seizures  HPI: Patient is a 27 of history of epilepsy who is on Lamictal he presents emergency department with 3 witnessed seizures today at work. He seizure was tonic-clonic in nature and lasted approximately for 1 minute. Seizures were separated by approximately 5 minutes. He was postictal between each seizure and was never back to baseline. Patient reports that he has been taking his Lamictal and has not missed any doses. He states that he has not been sleeping well and has increased stress. He is scheduled to have a 24 hour EEG with Dr. Anne HahnWillis on Monday, 6 days from now. He denies any recent fevers, cough, vomiting or diarrhea. No numbness, tingling or focal weakness.  ROS: See HPI Constitutional: no fever  Eyes: no drainage  ENT: no runny nose   Cardiovascular:  no chest pain  Resp: no SOB  GI: no vomiting GU: no dysuria Integumentary: no rash  Allergy: no hives  Musculoskeletal: no leg swelling  Neurological: no slurred speech ROS otherwise negative  PAST MEDICAL HISTORY/PAST SURGICAL HISTORY:  Past Medical History  Diagnosis Date  . Seizure   . JME (juvenile myoclonic epilepsy)     MEDICATIONS:  Prior to Admission medications   Medication Sig Start Date End Date Taking? Authorizing Provider  LamoTRIgine 250 MG TB24 Take 2 tablets by mouth at bedtime.   Yes Historical Provider, MD    ALLERGIES:  Allergies  Allergen Reactions  . Clarithromycin Nausea And Vomiting  . Oxycodone Itching and Rash    SOCIAL HISTORY:  History  Substance Use Topics  . Smoking status: Never Smoker   . Smokeless tobacco: Not on file  . Alcohol Use: Yes     Comment: occ    FAMILY HISTORY: History reviewed. No pertinent family history.  EXAM: BP 119/53  Pulse 85  Temp(Src) 98.1 F (36.7 C) (Oral)  Resp 17  SpO2 96% CONSTITUTIONAL: Alert and oriented x3 and responds appropriately to questions. Well-appearing; well-nourished, drowsy but arousable  with voice HEAD: Normocephalic EYES: Conjunctivae clear, PERRL ENT: normal nose; no rhinorrhea; moist mucous membranes; pharynx without lesions noted NECK: Supple, no meningismus, no LAD  CARD: RRR; S1 and S2 appreciated; no murmurs, no clicks, no rubs, no gallops RESP: Normal chest excursion without splinting or tachypnea; breath sounds clear and equal bilaterally; no wheezes, no rhonchi, no rales,  ABD/GI: Normal bowel sounds; non-distended; soft, non-tender, no rebound, no guarding BACK:  The back appears normal and is non-tender to palpation, there is no CVA tenderness EXT: Normal ROM in all joints; non-tender to palpation; no edema; normal capillary refill; no cyanosis    SKIN: Normal color for age and race; warm NEURO: Moves all extremities equally, sensation to light touch intact diffusely, cranial nerves II through XII intact PSYCH: The patient's mood and manner are appropriate. Grooming and personal hygiene are appropriate.  MEDICAL DECISION MAKING: Patient here with 3 seizures. He is currently neurologically intact but very drowsy. Will check labs, urine and discuss with his neurologist.  ED PROGRESS: Patient's labs showed mild hyperkalemia. He has signs of early repolarization on his EKG but this is unchanged. No interval changes are peaked T waves. We'll give IV fluids. Have discussed with patient he will need to have this level rechecked by his PCP next week. Have also discussed with patient's neurologist Dr. Anne HahnWillis who recommends starting the patient on Vimpat 50 mg twice daily and checking a Lamictal level. She states if the patient is stable, no further  seizure activity, she feels he is safe to be discharged home and I agree with close outpatient followup. He has an appointment scheduled in 6 days. Discussed with patient who agrees with this plan. Urinalysis and urine drug screen are pending.  Signed out to Dr. Criss Alvine who will reassess patient and then dc when he is more  awake.    EKG Interpretation  Date/Time:  Tuesday January 28 2014 13:46:08 EDT Ventricular Rate:  86 PR Interval:  135 QRS Duration: 108 QT Interval:  391 QTC Calculation: 468 R Axis:   90 Text Interpretation:  Sinus rhythm Consider right ventricular hypertrophy ST elev, probable normal early repol pattern No significant change since last tracing Confirmed by WARD,  DO, KRISTEN [phone]) on 01/28/2014 2:22:39 PM        Layla Maw Ward, DO 01/28/14 1646

## 2014-01-28 NOTE — ED Provider Notes (Signed)
6:24 PM patient is awake and alert. He is at this time stable for discharge status post his seizures. Discussed return precautions.  Audree CamelScott T Londen Bok, MD 01/28/14 [phone]

## 2014-01-28 NOTE — ED Notes (Signed)
Bed: WA08 Expected date: 01/28/14 Expected time: 1:16 PM Means of arrival:  Comments: EMS seizure

## 2014-01-28 NOTE — ED Notes (Signed)
Spoke with patient and he stated he would get specimen. Gave pt urnail.

## 2014-01-31 LAB — LAMOTRIGINE LEVEL: Lamotrigine Lvl: 11 ug/mL (ref 4.0–18.0)

## 2014-03-01 ENCOUNTER — Encounter (HOSPITAL_COMMUNITY): Payer: Self-pay | Admitting: Emergency Medicine

## 2014-03-01 ENCOUNTER — Emergency Department (HOSPITAL_COMMUNITY)
Admission: EM | Admit: 2014-03-01 | Discharge: 2014-03-01 | Disposition: A | Discharge status: Home / Self Care | Payer: No Typology Code available for payment source | Attending: Emergency Medicine | Admitting: Emergency Medicine

## 2014-03-01 DIAGNOSIS — R569 Unspecified convulsions: Secondary | ICD-10-CM | POA: Insufficient documentation

## 2014-03-01 DIAGNOSIS — G40909 Epilepsy, unspecified, not intractable, without status epilepticus: Secondary | ICD-10-CM | POA: Diagnosis not present

## 2014-03-01 DIAGNOSIS — Z79899 Other long term (current) drug therapy: Secondary | ICD-10-CM | POA: Insufficient documentation

## 2014-03-01 NOTE — ED Notes (Addendum)
Ambulated in hall without issue, given drink/snack and tolerating well at this time.

## 2014-03-01 NOTE — ED Notes (Signed)
Initial Contact - pt A+OX4, sitting up on stretcher talking on phone, pt denies needs/complaints.  Reports "not interested in any testing".  Skin PWD.  MAEI, self repositioning for comfort.  Speaking full/clear sentences.  Neuros grossly intact.  NAD.

## 2014-03-01 NOTE — Discharge Instructions (Signed)

## 2014-03-01 NOTE — ED Notes (Signed)
[facility]- Per EMS pt had witnessed grand mal seizure. Pt did not fall or hit head. Pt has no oral trauma or incontinence. Pt denies pain. Pt A&O and in NAD. Pt has hx of the same. Pt takes Lamictal, but has not taken meds today. Last seizure was 7 weeks ago.

## 2014-03-01 NOTE — ED Provider Notes (Signed)
CSN: [id number]     Arrival date & time 03/01/14  1053 History   First MD Initiated Contact with Patient 03/01/14 1112     Chief Complaint  Patient presents with  . Seizures     (Consider location/radiation/quality/duration/timing/severity/associated sxs/prior Treatment) Patient is a 28 y.o. male presenting with seizures. The history is provided by the patient. No language interpreter was used.  Seizures Seizure activity on arrival: no   Seizure type:  Grand mal Preceding symptoms: no nausea, no numbness and no vision change   Initial focality:  Multifocal Episode characteristics: abnormal movements   Postictal symptoms: confusion   Return to baseline: yes   Timing:  Once Number of seizures this episode:  1 Progression:  Resolved Context: medical non-compliance   Recent head injury:  No recent head injuries PTA treatment:  None History of seizures: yes     Past Medical History  Diagnosis Date  . Seizure   . JME (juvenile myoclonic epilepsy)    Past Surgical History  Procedure Laterality Date  . Pyloric stenosis surgery    . Elbow surgery    . Shoulder surgery Right    No family history on file. History  Substance Use Topics  . Smoking status: Never Smoker   . Smokeless tobacco: Not on file  . Alcohol Use: Yes     Comment: occ    Review of Systems  Neurological: Positive for seizures.  All other systems reviewed and are negative.     Allergies  Clarithromycin and Oxycodone  Home Medications   Prior to Admission medications   Medication Sig Start Date End Date Taking? Authorizing Provider  lacosamide (VIMPAT) 50 MG TABS tablet Take 1 tablet (50 mg total) by mouth 2 (two) times daily. 01/28/14  Yes Kristen N Ward, DO  LamoTRIgine (LAMICTAL XR) 250 MG TB24 Take 500 mg by mouth at bedtime.   Yes Historical Provider, MD   BP 129/65  Pulse 104  Temp(Src) 98.9 F (37.2 C) (Oral)  Resp 22  SpO2 93% Physical Exam  Nursing note and vitals  reviewed. Constitutional: He is oriented to person, place, and time. He appears well-developed and well-nourished.  HENT:  Head: Normocephalic and atraumatic.  Nose: Nose normal.  Mouth/Throat: Oropharynx is clear and moist.  Eyes: EOM are normal.  Neck: Normal range of motion.  Cardiovascular: Normal rate and normal heart sounds.   Pulmonary/Chest: Effort normal.  Abdominal: Soft. He exhibits no distension.  Musculoskeletal: Normal range of motion.  Neurological: He is alert and oriented to person, place, and time.  Skin: Skin is warm.  Psychiatric: He has a normal mood and affect.    ED Course  Procedures (including critical care time) Labs Review Labs Reviewed - No data to display  Imaging Review No results found.   EKG Interpretation None      MDM   Final diagnoses:  Seizure   Pt does not want labs or medications.   Pt reports he has seizures and he feels back to normal.   Pt reports he will take his medication.      Lonia SkinnerLeslie K CarrolltonSofia, PA-C 03/01/14 1158

## 2014-03-02 NOTE — ED Provider Notes (Signed)
Medical screening examination/treatment/procedure(s) were performed by non-physician practitioner and as supervising physician I was immediately available for consultation/collaboration.   EKG Interpretation None        Derenda Giddings, MD 03/02/14 0739 

## 2014-05-29 ENCOUNTER — Encounter (HOSPITAL_COMMUNITY): Payer: Self-pay | Admitting: Emergency Medicine

## 2014-05-29 ENCOUNTER — Emergency Department (HOSPITAL_COMMUNITY): Payer: No Typology Code available for payment source

## 2014-05-29 ENCOUNTER — Emergency Department (HOSPITAL_COMMUNITY)
Admission: EM | Admit: 2014-05-29 | Discharge: 2014-05-29 | Disposition: A | Discharge status: Home / Self Care | Payer: No Typology Code available for payment source | Attending: Emergency Medicine | Admitting: Emergency Medicine

## 2014-05-29 DIAGNOSIS — Y9289 Other specified places as the place of occurrence of the external cause: Secondary | ICD-10-CM | POA: Diagnosis not present

## 2014-05-29 DIAGNOSIS — R52 Pain, unspecified: Secondary | ICD-10-CM

## 2014-05-29 DIAGNOSIS — Y93F2 Activity, caregiving, lifting: Secondary | ICD-10-CM | POA: Insufficient documentation

## 2014-05-29 DIAGNOSIS — Y998 Other external cause status: Secondary | ICD-10-CM | POA: Diagnosis not present

## 2014-05-29 DIAGNOSIS — X58XXXA Exposure to other specified factors, initial encounter: Secondary | ICD-10-CM | POA: Diagnosis not present

## 2014-05-29 DIAGNOSIS — T1490XA Injury, unspecified, initial encounter: Secondary | ICD-10-CM

## 2014-05-29 DIAGNOSIS — Z9889 Other specified postprocedural states: Secondary | ICD-10-CM | POA: Diagnosis not present

## 2014-05-29 DIAGNOSIS — S4991XA Unspecified injury of right shoulder and upper arm, initial encounter: Secondary | ICD-10-CM | POA: Diagnosis not present

## 2014-05-29 DIAGNOSIS — G40909 Epilepsy, unspecified, not intractable, without status epilepticus: Secondary | ICD-10-CM | POA: Diagnosis not present

## 2014-05-29 DIAGNOSIS — M25511 Pain in right shoulder: Secondary | ICD-10-CM

## 2014-05-29 DIAGNOSIS — Z79899 Other long term (current) drug therapy: Secondary | ICD-10-CM | POA: Diagnosis not present

## 2014-05-29 MED ORDER — NAPROXEN 500 MG PO TABS: 500.0000 mg | ORAL_TABLET | Freq: Two times a day (BID) | ORAL | Status: DC

## 2014-05-29 MED ORDER — NAPROXEN 250 MG PO TABS
500.0000 mg | ORAL_TABLET | Freq: Two times a day (BID) | ORAL | Status: DC
  Administered 2014-05-29: 500 mg via ORAL
  Filled 2014-05-29: qty 2

## 2014-05-29 NOTE — ED Provider Notes (Signed)
CSN: [id number]     Arrival date & time 05/29/14  0154 History   First MD Initiated Contact with Patient 05/29/14 [phone]     Chief Complaint  Patient presents with  . Shoulder Injury     (Consider location/radiation/quality/duration/timing/severity/associated sxs/prior Treatment) HPI 28 year old male presents to emergency department with complaint of right shoulder pain.  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 August Saucerean.  He reports a week ago at work he was lifting and felt a pull.  2 nights ago he was lifting and felt a pop and had excruciating pain.  Patient reports he has moderate pain at this time.  He works at [employer].  His job involves lifting heavy things on a regular basis.  She was seen a week ago by the occupational physician, no acute injury was noted at that time.  He has not yet followed back up with his orthopedist.  He denies any weakness or numbness in the distal arm.  Patient is right-hand dominant. Past Medical History  Diagnosis Date  . Seizure   . JME (juvenile myoclonic epilepsy)    Past Surgical History  Procedure Laterality Date  . Pyloric stenosis surgery    . Elbow surgery    . Shoulder surgery Right    No family history on file. History  Substance Use Topics  . Smoking status: Never Smoker   . Smokeless tobacco: Not on file  . Alcohol Use: Yes     Comment: occ    Review of Systems   See History of Present Illness; otherwise all other systems are reviewed and negative  Allergies  Clarithromycin; Dye fdc red; and Oxycodone  Home Medications   Prior to Admission medications   Medication Sig Start Date End Date Taking? Authorizing Provider  lacosamide (VIMPAT) 50 MG TABS tablet Take 1 tablet (50 mg total) by mouth 2 (two) times daily. 01/28/14   Kristen N Ward, DO  LamoTRIgine (LAMICTAL XR) 250 MG TB24 Take 500 mg by mouth at bedtime.    Historical Provider, MD   BP 117/57 mmHg  Pulse 56  Temp(Src) 97.8 F (36.6  C) (Oral)  Resp 16  Wt 195 lb (88.451 kg)  SpO2 98% Physical Exam  Musculoskeletal: Normal range of motion. He exhibits tenderness (point tenderness to right anterior shoulder without effusion, deformity or other abnormality). He exhibits no edema.  Nursing note and vitals reviewed.   ED Course  Procedures (including critical care time) Labs Review Labs Reviewed - No data to display  Imaging Review Dg Shoulder Right  05/29/2014   CLINICAL DATA:  Pain following lifting boxes, initial encounter  EXAM: RIGHT SHOULDER - 2+ VIEW  COMPARISON:  None.  FINDINGS: There is no evidence of fracture or dislocation. There is no evidence of arthropathy or other focal bone abnormality. Soft tissues are unremarkable.  IMPRESSION: No acute abnormality noted.   Electronically Signed   By: Alcide CleverMark  Lukens M.D.   On: 05/29/2014 02:31     EKG Interpretation None      MDM   Final diagnoses:  Right shoulder pain    28 year old male with right shoulder pain.  He has prior surgery to this area.  X-rays without bony abnormality, no effusion or crepitus on exam.  Patient only has mild tenderness to anterior humerus with palpation.  Plan to have the patient follow back up with his orthopedist in the meantime, we'll start him on Naprosyn.    Olivia Mackielga M Micah Barnier,  MD 05/29/14 [id number]

## 2014-05-29 NOTE — ED Notes (Signed)
Pt. reports injury/pain  at right shoulder after lifting heavy boxes at work last week .

## 2014-05-29 NOTE — Discharge Instructions (Signed)
Shoulder Pain °The shoulder is the joint that connects your arms to your body. The bones that form the shoulder joint include the upper arm bone (humerus), the shoulder blade (scapula), and the collarbone (clavicle). The top of the humerus is shaped like a ball and fits into a rather flat socket on the scapula (glenoid cavity). A combination of muscles and strong, fibrous tissues that connect muscles to bones (tendons) support your shoulder joint and hold the ball in the socket. Small, fluid-filled sacs (bursae) are located in different areas of the joint. They act as cushions between the bones and the overlying soft tissues and help reduce friction between the gliding tendons and the bone as you move your arm. Your shoulder joint allows a wide range of motion in your arm. This range of motion allows you to do things like scratch your back or throw a ball. However, this range of motion also makes your shoulder more prone to pain from overuse and injury. °Causes of shoulder pain can originate from both injury and overuse and usually can be grouped in the following four categories: °· Redness, swelling, and pain (inflammation) of the tendon (tendinitis) or the bursae (bursitis). °· Instability, such as a dislocation of the joint. °· Inflammation of the joint (arthritis). °· Broken bone (fracture). °HOME CARE INSTRUCTIONS  °· Apply ice to the sore area. °· Put ice in a plastic bag. °· Place a towel between your skin and the bag. °· Leave the ice on for 15-20 minutes, 3-4 times per day for the first 2 days, or as directed by your health care provider. °· Stop using cold packs if they do not help with the pain. °· If you have a shoulder sling or immobilizer, wear it as long as your caregiver instructs. Only remove it to shower or bathe. Move your arm as little as possible, but keep your hand moving to prevent swelling. °· Squeeze a soft ball or foam pad as much as possible to help prevent swelling. °· Only take  over-the-counter or prescription medicines for pain, discomfort, or fever as directed by your caregiver. °SEEK MEDICAL CARE IF:  °· Your shoulder pain increases, or new pain develops in your arm, hand, or fingers. °· Your hand or fingers become cold and numb. °· Your pain is not relieved with medicines. °SEEK IMMEDIATE MEDICAL CARE IF:  °· Your arm, hand, or fingers are numb or tingling. °· Your arm, hand, or fingers are significantly swollen or turn white or blue. °MAKE SURE YOU:  °· Understand these instructions. °· Will watch your condition. °· Will get help right away if you are not doing well or get worse. °Document Released: 04/06/2005 Document Revised: 11/11/2013 Document Reviewed: 06/11/2011 °ExitCare® Patient Information ©2015 [employer]. This information is not intended to replace advice given to you by your health care provider. Make sure you discuss any questions you have with your health care provider. °Shoulder Range of Motion Exercises °The shoulder is the most flexible joint in the human body. Because of this it is also the most unstable joint in the body. All ages can develop shoulder problems. Early treatment of problems is necessary for a good outcome. People react to shoulder pain by decreasing the movement of the joint. After a brief period of time, the shoulder can become "frozen". This is an almost complete loss of the ability to move the damaged shoulder. Following injuries your caregivers can give you instructions on exercises to keep your range of motion (ability to   move your shoulder freely), or regain it if it has been lost.  °EXERCISES °EXERCISES TO MAINTAIN THE MOBILITY OF YOUR SHOULDER: °Codman's Exercise or Pendulum Exercise °· This exercise may be performed in a prone (face-down) lying position or standing while leaning on a chair with the opposite arm. Its purpose is to relax the muscles in your shoulder and slowly but surely increase the range of motion and to relieve  pain. °¨ Lie on your stomach close to the side edge of the bed. Let your weak arm hang over the edge of the bed. Relax your shoulder, arm and hand. Let your shoulder blade relax and drop down. °¨ Slowly and gently swing your arm forward and back. Do not use your neck muscles; relax them. It might be easier to have someone else gently start swinging your arm. °¨ As pain decreases, increase your swing. To start, arm swing should begin at 15 degree angles. In time and as pain lessens, move to 30-45 degree angles. Start with swinging for about 15 seconds, and work towards swinging for 3 to 5 minutes. °· This exercise may also be performed in a standing/bent over position. °¨ Stand and hold onto a sturdy chair with your good arm. Bend forward at the waist and bend your knees slightly to help protect your back. Relax your weak arm, let it hang limp. Relax your shoulder blade and let it drop. °¨ Keep your shoulder relaxed and use body motion to swing your arm in small circles. °¨ Stand up tall and relax. °¨ Repeat motion and change direction of circles. °¨ Start with swinging for about 30 seconds, and work towards swinging for 3 to 5 minutes. °STRETCHING EXERCISES: °· Lift your arm out in front of you with the elbow bent at 90 degrees. Using your other arm gently pull the elbow forward and across your body. °· Bend one arm behind you with the palm facing outward. Using the other arm, hold a towel or rope and reach this arm up above your head, then bend it at the elbow to move your wrist to behind your neck. Grab the free end of the towel with the hand behind your back. Gently pull the towel up with the hand behind your neck, gradually increasing the pull on the hand behind the small of your back. Then, gradually pull down with the hand behind the small of your back. This will pull the hand and arm behind your neck further. Both shoulders will have an increased range of motion with repetition of this  exercise. °STRENGTHENING EXERCISES: °· Standing with your arm at your side and straight out from your shoulder with the elbow bent at 90 degrees, hold onto a small weight and slowly raise your hand so it points straight up in the air. Repeat this five times to begin with, and gradually increase to ten times. Do this four times per day. As you grow stronger you can gradually increase the weight. °· Repeat the above exercise, only this time using an elastic band. Start with your hand up in the air and pull down until your hand is by your side. As you grow stronger, gradually increase the amount you pull by increasing the number or size of the elastic bands. Use the same amount of repetitions. °· Standing with your hand at your side and holding onto a weight, gradually lift the hand in front of you until it is over your head. Do the same also with the hand remaining at   your side and lift the hand away from your body until it is again over your head. Repeat this five times to begin with, and gradually increase to ten times. Do this four times per day. As you grow stronger you can gradually increase the weight. °Document Released: 03/26/2003 Document Revised: 07/02/2013 Document Reviewed: 06/27/2005 °ExitCare® Patient Information ©2015 [employer], LLC. This information is not intended to replace advice given to you by your health care provider. Make sure you discuss any questions you have with your health care provider. ° °

## 2014-06-27 ENCOUNTER — Other Ambulatory Visit: Payer: Self-pay | Admitting: Orthopedic Surgery

## 2014-06-27 DIAGNOSIS — M25511 Pain in right shoulder: Secondary | ICD-10-CM

## 2014-07-15 ENCOUNTER — Other Ambulatory Visit: Payer: No Typology Code available for payment source

## 2014-07-30 ENCOUNTER — Inpatient Hospital Stay: Admission: RE | Admit: 2014-07-30 | Payer: No Typology Code available for payment source | Source: Ambulatory Visit

## 2014-08-10 ENCOUNTER — Encounter (HOSPITAL_COMMUNITY): Payer: Self-pay | Admitting: Emergency Medicine

## 2014-08-10 ENCOUNTER — Emergency Department (HOSPITAL_COMMUNITY)
Admission: EM | Admit: 2014-08-10 | Discharge: 2014-08-10 | Disposition: A | Discharge status: Home / Self Care | Payer: No Typology Code available for payment source | Attending: Emergency Medicine | Admitting: Emergency Medicine

## 2014-08-10 DIAGNOSIS — Z791 Long term (current) use of non-steroidal anti-inflammatories (NSAID): Secondary | ICD-10-CM | POA: Insufficient documentation

## 2014-08-10 DIAGNOSIS — G40909 Epilepsy, unspecified, not intractable, without status epilepticus: Secondary | ICD-10-CM | POA: Insufficient documentation

## 2014-08-10 DIAGNOSIS — Z79899 Other long term (current) drug therapy: Secondary | ICD-10-CM | POA: Insufficient documentation

## 2014-08-10 MED ORDER — LAMOTRIGINE 100 MG PO TABS: ORAL_TABLET | ORAL | Status: DC

## 2014-08-10 MED ORDER — LAMOTRIGINE 25 MG PO TABS
50.0000 mg | ORAL_TABLET | Freq: Once | ORAL | Status: AC
  Administered 2014-08-10: 50 mg via ORAL
  Filled 2014-08-10: qty 2

## 2014-08-10 MED ORDER — LACOSAMIDE 50 MG PO TABS
50.0000 mg | ORAL_TABLET | Freq: Two times a day (BID) | ORAL | Status: DC
Start: 2014-08-10 — End: 2015-09-14

## 2014-08-10 MED ORDER — LACOSAMIDE 50 MG PO TABS
50.0000 mg | ORAL_TABLET | Freq: Two times a day (BID) | ORAL | Status: DC
  Administered 2014-08-10: 50 mg via ORAL
  Filled 2014-08-10: qty 1

## 2014-08-10 NOTE — ED Notes (Signed)
MD at bedside. 

## 2014-08-10 NOTE — ED Notes (Signed)
Case management provided patient with match card where he can get both medications for $3. He was also advised to [facility].

## 2014-08-10 NOTE — ED Notes (Addendum)
Per pt, states he has been off his Lamictal for about since September-cant afford it-had a witnessed seizure that lasted about 5 minutes per friend

## 2014-08-10 NOTE — ED Notes (Signed)
Pt reports last seizure was in June. He is alert and oriented upon assessment. He reports 2/10 neck soreness.

## 2014-08-10 NOTE — Care Management Note (Signed)
CARE MANAGEMENT NOTE 08/10/2014  Patient:  Paul Dorsey,Paul Dorsey   Account Number:  [id number]  Date Initiated:  08/10/2014  Documentation initiated by:  Antony HasteBENNETT,Deyanna Mctier HARRIS  Subjective/Objective Assessment:   seizure     Action/Plan:   Anticipated DC Date:  08/10/2014   Anticipated DC Plan:        DC Planning Services  MATCH Program  CM consult  Medication Assistance      Choice offered to / List presented to:             Status of service:   Medicare Important Message given?   (If response is "NO", the following Medicare IM given date fields will be blank) Date Medicare IM given:   Medicare IM given by:   Date Additional Medicare IM given:   Additional Medicare IM given by:    Discharge Disposition:    Per UR Regulation:    If discussed at Long Length of Stay Meetings, dates discussed:    Comments:  08/10/14 0940 - CM spoke with patient. Provided MATCH letter and brochure [facility]. Discussed contacting [facility] on Monday (tomorrow) to schedule an appointment. Patient has someone at the bedside who verbalizes understanding of f/u and medication assistance. Rubie Maidrystal Luwana Butrick RN BSN CCM [phone]

## 2014-08-10 NOTE — ED Provider Notes (Addendum)
CSN: [id number]     Arrival date & time 08/10/14  0727 History   First MD Initiated Contact with Patient 08/10/14 0732     No chief complaint on file.  chief complaint seizure   (Consider location/radiation/quality/duration/timing/severity/associated sxs/prior Treatment) HPI patient had a grand mal seizure today at 6:48 AM as witnessed by his friend. Last approximate 5 minutes. Presently patient is asymptomatic. He has not had any Vimpat or Lamictal since September 2015. No treatment prior to coming here no other associated symptoms  Past Medical History  Diagnosis Date  . Seizure   . JME (juvenile myoclonic epilepsy)    Past Surgical History  Procedure Laterality Date  . Pyloric stenosis surgery    . Elbow surgery    . Shoulder surgery Right    No family history on file. History  Substance Use Topics  . Smoking status: Never Smoker   . Smokeless tobacco: Not on file  . Alcohol Use: Yes     Comment: occ   Rare alcohol no illicit drug use  Review of Systems  Neurological: Positive for seizures.  All other systems reviewed and are negative.     Allergies  Clarithromycin; Dye fdc red; and Oxycodone  Home Medications   Prior to Admission medications   Medication Sig Start Date End Date Taking? Authorizing Provider  lacosamide (VIMPAT) 50 MG TABS tablet Take 1 tablet (50 mg total) by mouth 2 (two) times daily. 01/28/14   Kristen N Ward, DO  LamoTRIgine (LAMICTAL XR) 250 MG TB24 Take 500 mg by mouth at bedtime.    Historical Provider, MD  naproxen (NAPROSYN) 500 MG tablet Take 1 tablet (500 mg total) by mouth 2 (two) times daily with a meal. 05/29/14   Olivia Mackie, MD   There were no vitals taken for this visit. Physical Exam  Constitutional: He is oriented to person, place, and time. He appears well-developed and well-nourished.  HENT:  Head: Normocephalic and atraumatic.  Eyes: Conjunctivae are normal. Pupils are equal, round, and reactive to light.  Neck: Neck  supple. No tracheal deviation present. No thyromegaly present.  Cardiovascular: Normal rate and regular rhythm.   No murmur heard. Pulmonary/Chest: Effort normal and breath sounds normal.  Abdominal: Soft. Bowel sounds are normal. He exhibits no distension. There is no tenderness.  Musculoskeletal: Normal range of motion. He exhibits no edema or tenderness.  Neurological: He is alert and oriented to person, place, and time. He has normal reflexes. No cranial nerve deficit. Coordination normal.  DTRs symmetric bilaterally at knee jerk ankle jerk biceps toes downward going bilaterally  Skin: Skin is warm and dry. No rash noted.  Psychiatric: He has a normal mood and affect.  Nursing note and vitals reviewed.   ED Course  Procedures (including critical care time) Labs Review Labs Reviewed - No data to display  Imaging Review No results found.   EKG Interpretation None     Patient administered Vimpat and Lamictal dose here.  MDM  Case management consult to help patient to afford his medications.  After discussion with the hospital pharmacist will write prescriptions for Lamictal 50 mg twice a day for 1 week followed by 100 mg 3 times a day for one week, followed by 150 mg for 1 week followed by 200 mg twice a day. Also prescription Vimpat 50 Mill grams twice a day  Final diagnoses:  None   case management arranged for patient to get prescriptions filled  onmatch program. He will also be referred  [facility] Diagnosis seizure disorder     Doug SouSam Brytnee Bechler, MD 08/10/14 [id number]  Doug SouSam Shrey Boike, MD 08/10/14 [phone]

## 2014-08-10 NOTE — Discharge Instructions (Signed)
Seizure, Adult Call the [facility] Center tomorrow to schedule the next available appointment A seizure is abnormal electrical activity in the brain. Seizures usually last from 30 seconds to 2 minutes. There are various types of seizures. Before a seizure, you may have a warning sensation (aura) that a seizure is about to occur. An aura may include the following symptoms:   Fear or anxiety.  Nausea.  Feeling like the room is spinning (vertigo).  Vision changes, such as seeing flashing lights or spots. Common symptoms during a seizure include:  A change in attention or behavior (altered mental status).  Convulsions with rhythmic jerking movements.  Drooling.  Rapid eye movements.  Grunting.  Loss of bladder and bowel control.  Bitter taste in the mouth.  Tongue biting. After a seizure, you may feel confused and sleepy. You may also have an injury resulting from convulsions during the seizure. HOME CARE INSTRUCTIONS   If you are given medicines, take them exactly as prescribed by your health care provider.  Keep all follow-up appointments as directed by your health care provider.  Do not swim or drive or engage in risky activity during which a seizure could cause further injury to you or others until your health care provider says it is OK.  Get adequate rest.  Teach friends and family what to do if you have a seizure. They should:  Lay you on the ground to prevent a fall.  Put a cushion under your head.  Loosen any tight clothing around your neck.  Turn you on your side. If vomiting occurs, this helps keep your airway clear.  Stay with you until you recover.  Know whether or not you need emergency care. SEEK IMMEDIATE MEDICAL CARE IF:  The seizure lasts longer than 5 minutes.  The seizure is severe or you do not wake up immediately after the seizure.  You have an altered mental status after the seizure.  You are having more frequent  or worsening seizures. Someone should drive you to the emergency department or call local emergency services (911 in [place] MAKE SURE YOU:  Understand these instructions.  Will watch your condition.  Will get help right away if you are not doing well or get worse. Document Released: 06/24/2000 Document Revised: 04/17/2013 Document Reviewed: 02/06/2013 [facility] Patient Information 2015 [place]. This information is not intended to replace advice given to you by your health care provider. Make sure you discuss any questions you have with your health care provider.

## 2014-08-10 NOTE — ED Notes (Signed)
Pt upset that he can't afford his medication. He was made aware that there is a consult to case management to see what options are available. Pt states " I don't see why I'm going to get medication here and then go back to not having medication at home". Pt seems unwilling to pay for medications even if assistance is provided.  It was made aware of the importance of taking seizure medications and keep up to date prescriptions. Pt keeps stating "I'm just angry about the situation".

## 2014-08-10 NOTE — ED Notes (Signed)
Pt alert, oriented, and ambulatory upon DC. Medication match information and [facility].

## 2015-09-14 ENCOUNTER — Emergency Department (HOSPITAL_BASED_OUTPATIENT_CLINIC_OR_DEPARTMENT_OTHER)
Admission: EM | Admit: 2015-09-14 | Discharge: 2015-09-14 | Disposition: A | Discharge status: Home / Self Care | Payer: Self-pay | Attending: Emergency Medicine | Admitting: Emergency Medicine

## 2015-09-14 ENCOUNTER — Encounter (HOSPITAL_BASED_OUTPATIENT_CLINIC_OR_DEPARTMENT_OTHER): Payer: Self-pay | Admitting: *Deleted

## 2015-09-14 ENCOUNTER — Emergency Department (HOSPITAL_BASED_OUTPATIENT_CLINIC_OR_DEPARTMENT_OTHER): Payer: Self-pay

## 2015-09-14 DIAGNOSIS — W2105XA Struck by basketball, initial encounter: Secondary | ICD-10-CM | POA: Insufficient documentation

## 2015-09-14 DIAGNOSIS — Y92310 Basketball court as the place of occurrence of the external cause: Secondary | ICD-10-CM | POA: Insufficient documentation

## 2015-09-14 DIAGNOSIS — Y9367 Activity, basketball: Secondary | ICD-10-CM | POA: Insufficient documentation

## 2015-09-14 DIAGNOSIS — S63642A Sprain of metacarpophalangeal joint of left thumb, initial encounter: Secondary | ICD-10-CM | POA: Insufficient documentation

## 2015-09-14 DIAGNOSIS — Z8669 Personal history of other diseases of the nervous system and sense organs: Secondary | ICD-10-CM | POA: Insufficient documentation

## 2015-09-14 DIAGNOSIS — Y998 Other external cause status: Secondary | ICD-10-CM | POA: Insufficient documentation

## 2015-09-14 DIAGNOSIS — S63649A Sprain of metacarpophalangeal joint of unspecified thumb, initial encounter: Secondary | ICD-10-CM

## 2015-09-14 MED ORDER — NAPROXEN 500 MG PO TABS: 500.0000 mg | ORAL_TABLET | Freq: Two times a day (BID) | ORAL | Status: DC

## 2015-09-14 NOTE — ED Notes (Signed)
MD at bedside. 

## 2015-09-14 NOTE — ED Provider Notes (Signed)
CSN: [id number]     Arrival date & time 09/14/15  0805 History  By signing my name below, I, Sonum Patel, attest that this documentation has been prepared under the direction and in the presence of Glynn Octave, MD. Electronically Signed: Sonum Patel, Neurosurgeon. 09/14/2015. 8:17 AM.    Chief Complaint  Patient presents with  . Hand Pain   The history is provided by the patient. No language interpreter was used.    HPI Comments: RAMEEN QUINNEY is a 30 y.o. male who presents to the Emergency Department complaining of a left hand injury that occurred last night. Patient states he initially injured the same area last week while playing basketball. He had pain at that time which gradually improved until last night when he re-injured the same area while playing basketball again. He states the pain prevents him from gripping small objects in the affected hand. He has taken naproxen without significant relief. He denies numbness, weakness. He has a history of seizures but states he has not taken any anti-seizure medications or had a seizure for the last 2 years.  Past Medical History  Diagnosis Date  . Seizure (HCC)   . 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 Pyloric stenosis surgery    . Elbow surgery    . Shoulder surgery Right    History reviewed. No pertinent family history. Social History  Substance Use Topics  . Smoking status: Never Smoker   . Smokeless tobacco: None  . Alcohol Use: Yes     Comment: occ    Review of Systems  A complete 10 system review of systems was obtained and all systems are negative except as noted in the HPI and PMH.    Allergies  Clarithromycin; Dye fdc red; and Oxycodone  Home Medications   Prior to Admission medications   Medication Sig Start Date End Date Taking? Authorizing Provider  naproxen (NAPROSYN) 500 MG tablet Take 1 tablet (500 mg total) by mouth 2 (two) times daily. 09/14/15   Glynn Octave, MD   BP 123/84 mmHg  Pulse 65  Temp(Src) 97.5 F (36.4 C) (Oral)  Resp 14  Ht  (1.854 m)  Wt 188 lb (85.276 kg)  BMI 24.81 kg/m2  SpO2 99% Physical Exam  Constitutional: He is oriented to person, place, and time. He appears well-developed and well-nourished. No distress.  HENT:  Head: Normocephalic and atraumatic.  Mouth/Throat: Oropharynx is clear and moist. No oropharyngeal exudate.  Eyes: Conjunctivae and EOM are normal. Pupils are equal, round, and reactive to light.  Neck: Normal range of motion. Neck supple.  No meningismus.  Cardiovascular: Normal rate, regular rhythm, normal heart sounds and intact distal pulses.   No murmur heard. Pulmonary/Chest: Effort normal and breath sounds normal. No respiratory distress.  Abdominal: Soft. There is no tenderness. There is no rebound and no guarding.  Musculoskeletal: He exhibits tenderness. He exhibits no edema.  Tenderness to palpation to the base of the left thumb with reduced ROM. Extension and opposition intact. Distal sensation normal. Intact radial pulse.   Neurological: He is alert and oriented to person, place, and time. He has normal strength. No cranial nerve deficit or sensory deficit. He exhibits normal muscle tone. Coordination normal.  No ataxia on finger to nose bilaterally. No pronator drift. 5/5 strength throughout. CN 2-12 intact.Equal grip strength. Sensation intact.   Skin: Skin is warm.  Psychiatric: He has a normal mood and affect.  His behavior is normal.  Nursing note and vitals reviewed.   ED Course  Procedures (including critical care time)  DIAGNOSTIC STUDIES: Oxygen Saturation is 99% on RA, normal by my interpretation.    COORDINATION OF CARE: 8:22 AM Will order imaging of the left hand. Discussed treatment plan with pt at bedside and pt agreed to plan.   Labs Review Labs Reviewed - No data to display  Imaging Review Dg Finger Thumb Left  09/14/2015  CLINICAL DATA:  Basketball injury  with left thumb pain, initial encounter EXAM: LEFT THUMB 2+V COMPARISON:  None. FINDINGS: There is no evidence of fracture or dislocation. There is no evidence of arthropathy or other focal bone abnormality. Soft tissues are unremarkable IMPRESSION: No acute abnormality noted. Electronically Signed   By: Alcide CleverMark  Lukens M.D.   On: 09/14/2015 08:41   I have personally reviewed and evaluated these images as part of my medical decision-making.   EKG Interpretation None      MDM   Final diagnoses:  Gamekeeper's thumb   Pain to base of left thumb from basketball injury. No weakness, numbness or tingling. No open wounds.  Suspect ligament sprain or strain, possibly gamekeeper's thumb. Patient will be splinted. X-rays negative for fracture.  Spica splint, anti-inflammatories, follow-up with hand surgery. Return precautions discussed.  I personally performed the services described in this documentation, which was scribed in my presence. The recorded information has been reviewed and is accurate.   Glynn OctaveStephen Isrrael Fluckiger, MD 09/14/15 [phone]

## 2015-09-14 NOTE — Discharge Instructions (Signed)
Skier's Thumb Repair Skier's thumb repair is a surgical procedure to repair a stretched or torn ligament in your thumb. Ligaments are strong bands of tissue that connect bones. The ligament that is injured in skier's thumb (ulnar collateral ligament) connects the bones that make up the joint that is at the base of your thumb. You may need this procedure if the torn ligament makes your thumb unstable. This type of injury can result from falling with your hand outstretched. It often happens to skiers who fall with ski poles in their hands. LET [facility] PROVID[facility] KNOW ABOUT:  Any allergies you have.  All medicines you are taking, including vitamins, herbs, eye drops, creams, and over-the-counter medicines.  Previous problems you or members of your family have had with the use of anesthetics.  Any blood disorders you have.  Previous surgeries you have had.  Any medical conditions you may have. RISKS AND COMPLICATIONS Generally, this is a safe procedure. However, problems may occur, including:  Pain.  Infection.  Stiffness or weakness.  Numbness. BEFORE THE PROCEDURE  Ask your health care provider about:  Changing or stopping your regular medicines. This is especially important if you are taking diabetes medicines or blood thinners.  Taking medicines such as aspirin and ibuprofen. These medicines can thin your blood. Do not take these medicines before your procedure if your health care provider instructs you not to.  Plan to have someone take you home after the procedure.  If you go home right after the procedure, plan to have someone with you for 24 hours.  Follow instructions from your health care provider about eating or drinking restrictions. PROCEDURE  An IV tube will be inserted into one of your veins.  You will be given one or more of the following:  A medicine that helps you relax (sedative).  A medicine that numbs the area (local anesthetic).  A medicine  that is injected into an area of your body that numbs everything below the injection site (regional anesthetic).  Your surgeon will make an incision at the base of your thumb, near the top of your wrist. This is done to locate the torn ligament.  Any small bone fragments will be removed.  The torn ligament will be stitched back together or stitched to the bones of your thumb joint.  Any bone fracture that occurred will be fixed with wires or screws.  The incision may be closed with stitches (sutures) or staples. The procedure may vary among health care providers and hospitals. AFTER THE PROCEDURE  Your blood pressure, heart rate, breathing rate and blood oxygen level will be monitored often until the medicines you were given have worn off.  You will be given pain medicine as needed.  You may have to wear an arm cast or splint to keep your thumb from moving.   This information is not intended to replace advice given to you by your health care provider. Make sure you discuss any questions you have with your health care provider.   Document Released: 06/17/2002 Document Revised: 11/11/2014 Document Reviewed: 06/18/2014 Elsevier Interactive Patient Education [employer]2016 Elsevier Inc.

## 2015-09-14 NOTE — ED Notes (Signed)
Pt reports basketball hitting his left hand last night, pain to thumb and 1st finger and between. Denies any other injuries or c/o.

## 2015-12-19 ENCOUNTER — Encounter (HOSPITAL_COMMUNITY): Payer: Self-pay

## 2015-12-19 ENCOUNTER — Emergency Department (HOSPITAL_COMMUNITY)
Admission: EM | Admit: 2015-12-19 | Discharge: 2015-12-19 | Disposition: A | Discharge status: Home / Self Care | Payer: No Typology Code available for payment source | Attending: Emergency Medicine | Admitting: Emergency Medicine

## 2015-12-19 DIAGNOSIS — R569 Unspecified convulsions: Secondary | ICD-10-CM | POA: Insufficient documentation

## 2015-12-19 DIAGNOSIS — Y939 Activity, unspecified: Secondary | ICD-10-CM | POA: Insufficient documentation

## 2015-12-19 DIAGNOSIS — Z791 Long term (current) use of non-steroidal anti-inflammatories (NSAID): Secondary | ICD-10-CM | POA: Insufficient documentation

## 2015-12-19 DIAGNOSIS — Y929 Unspecified place or not applicable: Secondary | ICD-10-CM | POA: Insufficient documentation

## 2015-12-19 DIAGNOSIS — W1809XA Striking against other object with subsequent fall, initial encounter: Secondary | ICD-10-CM | POA: Insufficient documentation

## 2015-12-19 DIAGNOSIS — Y990 Civilian activity done for income or pay: Secondary | ICD-10-CM | POA: Insufficient documentation

## 2015-12-19 LAB — CBG MONITORING, ED: Glucose-Capillary: 93 mg/dL (ref 65–99)

## 2015-12-19 NOTE — ED Notes (Signed)
CBG 93, nurse notified.

## 2015-12-19 NOTE — ED Notes (Signed)
Discharge instructions reviewed - voiced understanding 

## 2015-12-19 NOTE — ED Provider Notes (Addendum)
CSN: [id number]     Arrival date & time 12/19/15  0044 History   By signing my name below, I, Suzan Slick. Elon Spanner, attest that this documentation has been prepared under the direction and in the presence of Gwyneth Sprout, MD.  Electronically Signed: Suzan Slick. Elon Spanner, ED Scribe. 12/19/2015. 1:02 AM.   No chief complaint on file.  The history is provided by the patient. No language interpreter was used.    HPI Comments: Paul Dorsey is a 30 y.o. male with a PMHx of seizures who presents to the Emergency Department here after witnessed seizures which occurred just prior to arrival. Per EMS, pt was at work when he fell to the ground followed by 2 separate seizures both lasting 30 seconds in duration. Pt admits he did feel "weird" prior to onset of initial seizure and states he attempted to drink some water. He denies any fever, chills, neck pain, or shortness of breath. Currently he is not on any prescribed medications for his history of seizures. Last reported seizure 1 year ago. Pt states seizure episodes may have been brought on due to increased stress at work in the last few days.  PCP: Verlon Au, MD    Past Medical History  Diagnosis Date  . Seizure (HCC)   . 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 Pyloric stenosis surgery    . Elbow surgery    . Shoulder surgery Right    No family history on file. Social History  Substance Use Topics  . Smoking status: Never Smoker   . Smokeless tobacco: Not on file  . Alcohol Use: Yes     Comment: occ    Review of Systems  Constitutional: Negative for fever and chills.  Gastrointestinal: Negative for nausea and vomiting.  Musculoskeletal: Negative for back pain, arthralgias and neck pain.  Neurological: Positive for seizures.  All other systems reviewed and are negative.     Allergies  Clarithromycin; Dye fdc red; and Oxycodone  Home Medications   Prior to Admission  medications   Medication Sig Start Date End Date Taking? Authorizing Provider  naproxen (NAPROSYN) 500 MG tablet Take 1 tablet (500 mg total) by mouth 2 (two) times daily. 09/14/15   Glynn Octave, MD   Triage Vitals: BP 120/76 mmHg  Pulse 83  Temp(Src) 98.2 F (36.8 C) (Oral)  Resp 16  SpO2 97%   Physical Exam  Constitutional: He is oriented to person, place, and time. He appears well-developed and well-nourished.  HENT:  Head: Normocephalic and atraumatic.  Eyes: EOM are normal.  Neck: Normal range of motion.  Cardiovascular: Normal rate, regular rhythm, normal heart sounds and intact distal pulses.   Pulmonary/Chest: Effort normal and breath sounds normal. No respiratory distress.  Abdominal: Soft. He exhibits no distension. There is no tenderness.  Musculoskeletal: Normal range of motion.  Neurological: He is alert and oriented to person, place, and time.  Skin: Skin is warm and dry.  Psychiatric: He has a normal mood and affect. Judgment normal.  Nursing note and vitals reviewed.   ED Course  Procedures (including critical care time)  DIAGNOSTIC STUDIES: Oxygen Saturation is 97% on RA, adequate by my interpretation.    COORDINATION OF CARE: 12:59 AM-Discussed treatment plan with pt at bedside and pt agreed to plan.     Labs Review Labs Reviewed  CBG MONITORING, ED    Imaging Review No results found. I have personally reviewed and  evaluated these images and lab results as part of my medical decision-making.   EKG Interpretation None      MDM   Final diagnoses:  Seizure [facility](HCC)    Patient is a 30 year old male with a known seizure disorder who is not currently taking any medication for his seizures. He states he became very stressed tonight while at work and was then witnessed to have 2 generalized tonic-clonic seizures that lasted approximately 30 seconds each. Initially he was postictal but now he is back to baseline. He currently has no complaints. He  denies any drug use, lack of sleep or recent illness. CBG was 93. He initially had a cervical collar however he has no C-spine tenderness and cervical spine was cleared. Patient was able to ambulate without difficulty. At this time do not feel that he needs extensive testing. He's not taking medication and he has a seizure disorder. Today he had a breakthrough seizure.    I personally performed the services described in this documentation, which was scribed in my presence.  The recorded information has been reviewed and considered.   Gwyneth SproutWhitney Jalila Goodnough, MD 12/19/15 [id number]  Gwyneth SproutWhitney Quincy Boy, MD 12/19/15 [id number]

## 2015-12-19 NOTE — ED Notes (Signed)
Pt from work with seizure tonic clonic x2 witnessed for about 30 sec each, pt hit head on the wall in the process of the seizure. Pt is in no pain now and only complains of being tired.

## 2015-12-19 NOTE — ED Notes (Signed)
Pt ambulated around POD A with steady gait, NAD and states he feels fine and ready to go home.

## 2015-12-19 NOTE — Discharge Instructions (Signed)

## 2016-08-06 ENCOUNTER — Encounter (HOSPITAL_BASED_OUTPATIENT_CLINIC_OR_DEPARTMENT_OTHER): Payer: Self-pay

## 2016-08-06 ENCOUNTER — Emergency Department (HOSPITAL_BASED_OUTPATIENT_CLINIC_OR_DEPARTMENT_OTHER): Payer: Self-pay

## 2016-08-06 ENCOUNTER — Emergency Department (HOSPITAL_BASED_OUTPATIENT_CLINIC_OR_DEPARTMENT_OTHER)
Admission: EM | Admit: 2016-08-06 | Discharge: 2016-08-06 | Disposition: A | Discharge status: Home / Self Care | Payer: Self-pay | Attending: Emergency Medicine | Admitting: Emergency Medicine

## 2016-08-06 DIAGNOSIS — Y92009 Unspecified place in unspecified non-institutional (private) residence as the place of occurrence of the external cause: Secondary | ICD-10-CM | POA: Insufficient documentation

## 2016-08-06 DIAGNOSIS — M545 Low back pain, unspecified: Secondary | ICD-10-CM

## 2016-08-06 DIAGNOSIS — Y999 Unspecified external cause status: Secondary | ICD-10-CM | POA: Insufficient documentation

## 2016-08-06 DIAGNOSIS — W1839XA Other fall on same level, initial encounter: Secondary | ICD-10-CM | POA: Insufficient documentation

## 2016-08-06 DIAGNOSIS — Y9389 Activity, other specified: Secondary | ICD-10-CM | POA: Insufficient documentation

## 2016-08-06 MED ORDER — CYCLOBENZAPRINE HCL 10 MG PO TABS: 10.0000 mg | ORAL_TABLET | Freq: Two times a day (BID) | ORAL | 0 refills | Status: DC | PRN

## 2016-08-06 NOTE — ED Triage Notes (Signed)
Pt reports having a seizure on Thursday at home and hitting his back. Pt reports his back "locked up" earlier.

## 2016-08-06 NOTE — Discharge Instructions (Signed)
Please read attached information. If you experience any new or worsening signs or symptoms please return to the emergency room for evaluation. Please follow-up with your primary care provider or specialist as discussed. Please use medication prescribed only as directed and discontinue taking if you have any concerning signs or symptoms.   °

## 2016-08-06 NOTE — ED Notes (Signed)
Pt given d/c instructions as per chart. Rx x 1. Verbalizes understanding. No questions. 

## 2016-08-06 NOTE — ED Provider Notes (Signed)
[facility] Provider Note   CSN: [id number] Arrival date & time: 08/06/16  1731   By signing my name below, I, Teofilo PodMatthew P. Jamison, attest that this documentation has been prepared under the direction and in the presence of Newell RubbermaidJeffrey Fallon Howerter, PA-C. Electronically Signed: Teofilo PodMatthew P. Jamison, ED Scribe. 08/06/2016. 8:00 PM.   History   Chief Complaint Chief Complaint  Patient presents with  . Back Pain    The history is provided by the patient. No language interpreter was used.   HPI Comments:  Paul Dorsey is a 31 y.o. male with PMHx of epilepsy who presents to the Emergency Department complaining of worsening back pain x 2 days. Pt reports that he had a seizure on Thursday and he fell backwards and hit his back on the stove. He states that he did not have pain at the beginning of today, but later today at work his back "locked up" and he states that he could not lean forward or backward without being in excruciating pain. No alleviating factors noted. Pt denies leg numbness/pain, urinary symptoms.   Past Medical History:  Diagnosis Date  . JME (juvenile myoclonic epilepsy) (HCC)   . Seizure [facility](HCC)     Patient Active Problem List   Diagnosis Date Noted  . Dehydration 12/31/2011  . Seizures (HCC) 12/31/2011  . Hypotension 12/31/2011  . Tachycardia 12/31/2011    Past Surgical History:  Procedure Laterality Date  . ELBOW SURGERY    . pyloric stenosis surgery    . SHOULDER SURGERY Right        Home Medications    Prior to Admission medications   Medication Sig Start Date End Date Taking? Authorizing Provider  cyclobenzaprine (FLEXERIL) 10 MG tablet Take 1 tablet (10 mg total) by mouth 2 (two) times daily as needed for muscle spasms. 08/06/16   Eyvonne MechanicJeffrey Sheldon Amara, PA-C  naproxen (NAPROSYN) 500 MG tablet Take 1 tablet (500 mg total) by mouth 2 (two) times daily. 09/14/15   Glynn OctaveStephen Rancour, MD    Family History No family history on file.  Social History Social  History  Substance Use Topics  . Smoking status: Never Smoker  . Smokeless tobacco: Not on file  . Alcohol use Yes     Comment: occ     Allergies   Clarithromycin; Dye fdc red [red dye]; and Oxycodone   Review of Systems Review of Systems 10 Systems reviewed and are negative for acute change except as noted in the HPI.   Physical Exam Updated Vital Signs BP 126/81 (BP Location: Right Arm)   Pulse 85   Temp 98 F (36.7 C) (Oral)   Resp 16   Ht 6\' 1"  (1.854 m)   Wt 94.3 kg   SpO2 100%   BMI 27.44 kg/m   Physical Exam  Constitutional: He appears well-developed and well-nourished. No distress.  HENT:  Head: Normocephalic and atraumatic.  Eyes: Conjunctivae are normal.  Cardiovascular: Normal rate.   Pulmonary/Chest: Effort normal.  Abdominal: He exhibits no distension.  Musculoskeletal:  Tenderness to palpation of the lower lumbar soft tissues, and spinous process. Patient straight leg negative, distal sensation strength or motor function intact. No signs of trauma to the back. No thoracic or cervical spinal tenderness.  Neurological: He is alert.  Skin: Skin is warm and dry.  Psychiatric: He has a normal mood and affect.  Nursing note and vitals reviewed.    ED Treatments / Results  DIAGNOSTIC STUDIES:  Oxygen Saturation is 100% on RA, normal  by my interpretation.    COORDINATION OF CARE:  7:58 PM Will prescribe muscle relaxers, and recommended to use heat and ibuprofen. Discussed treatment plan with pt at bedside and pt agreed to plan.   Labs (all labs ordered are listed, but only abnormal results are displayed) Labs Reviewed - No data to display  EKG  EKG Interpretation None       Radiology Dg Lumbar Spine Complete  Result Date: 08/06/2016 CLINICAL DATA:  Fall with lower back pain.  Initial encounter. EXAM: LUMBAR SPINE - COMPLETE 4+ VIEW COMPARISON:  None. FINDINGS: There is no evidence of lumbar spine fracture. Alignment is normal.  Intervertebral disc spaces are maintained. IMPRESSION: Negative. Electronically Signed   By: Marnee Spring M.D.   On: 08/06/2016 20:57    Procedures Procedures (including critical care time)  Medications Ordered in ED Medications - No data to display   Initial Impression / Assessment and Plan / ED Course  I have reviewed the triage vital signs and the nursing notes.  Pertinent labs & imaging results that were available during my care of the patient were reviewed by me and considered in my medical decision making (see chart for details).    Labs:   Imaging:   Consults:   Therapeutics:   Discharge Meds:Flexeril  Assessment/Plan:   31 year old male presents today with uncle dictated back pain. He has no significant findings on plain films, no red flags for back pain. He'll be discharged home with symptomatic care instructions and strict return cautioned.  Final Clinical Impressions(s) / ED Diagnoses   Final diagnoses:  Acute low back pain without sciatica, unspecified back pain laterality    New Prescriptions Discharge Medication List as of 08/06/2016  9:21 PM    START taking these medications   Details  cyclobenzaprine (FLEXERIL) 10 MG tablet Take 1 tablet (10 mg total) by mouth 2 (two) times daily as needed for muscle spasms., Starting Sat 08/06/2016, Print       I personally performed the services described in this documentation, which was scribed in my presence. The recorded information has been reviewed and is accurate.     Eyvonne Mechanic, PA-C 08/07/16 0129    Canary Brim Tegeler, MD 08/07/16 [phone]

## 2016-08-06 NOTE — ED Notes (Signed)
Patient transported to X-ray 

## 2016-08-06 NOTE — ED Notes (Signed)
Pt in X ray

## 2016-08-10 ENCOUNTER — Emergency Department (HOSPITAL_COMMUNITY): Payer: No Typology Code available for payment source

## 2016-08-10 ENCOUNTER — Emergency Department (HOSPITAL_COMMUNITY)
Admission: EM | Admit: 2016-08-10 | Discharge: 2016-08-10 | Disposition: A | Discharge status: Home / Self Care | Payer: No Typology Code available for payment source | Attending: Emergency Medicine | Admitting: Emergency Medicine

## 2016-08-10 ENCOUNTER — Encounter (HOSPITAL_COMMUNITY): Payer: Self-pay | Admitting: Emergency Medicine

## 2016-08-10 DIAGNOSIS — Y9389 Activity, other specified: Secondary | ICD-10-CM | POA: Insufficient documentation

## 2016-08-10 DIAGNOSIS — G8929 Other chronic pain: Secondary | ICD-10-CM | POA: Insufficient documentation

## 2016-08-10 DIAGNOSIS — G40909 Epilepsy, unspecified, not intractable, without status epilepticus: Secondary | ICD-10-CM | POA: Diagnosis not present

## 2016-08-10 DIAGNOSIS — Y999 Unspecified external cause status: Secondary | ICD-10-CM | POA: Insufficient documentation

## 2016-08-10 DIAGNOSIS — R569 Unspecified convulsions: Secondary | ICD-10-CM

## 2016-08-10 DIAGNOSIS — Y92410 Unspecified street and highway as the place of occurrence of the external cause: Secondary | ICD-10-CM | POA: Insufficient documentation

## 2016-08-10 DIAGNOSIS — M545 Low back pain: Secondary | ICD-10-CM | POA: Insufficient documentation

## 2016-08-10 LAB — CBC WITH DIFFERENTIAL/PLATELET
Basophils Absolute: 0 10*3/uL (ref 0.0–0.1)
Basophils Relative: 0 %
EOS ABS: 0 10*3/uL (ref 0.0–0.7)
Eosinophils Relative: 0 %
HCT: 44.7 % (ref 39.0–52.0)
HEMOGLOBIN: 15.5 g/dL (ref 13.0–17.0)
LYMPHS PCT: 13 %
Lymphs Abs: 1 10*3/uL (ref 0.7–4.0)
MCH: 31.2 pg (ref 26.0–34.0)
MCHC: 34.7 g/dL (ref 30.0–36.0)
MCV: 89.9 fL (ref 78.0–100.0)
Monocytes Absolute: 0.7 10*3/uL (ref 0.1–1.0)
Monocytes Relative: 8 %
Neutro Abs: 6.3 10*3/uL (ref 1.7–7.7)
Neutrophils Relative %: 79 %
Platelets: 191 10*3/uL (ref 150–400)
RBC: 4.97 MIL/uL (ref 4.22–5.81)
RDW: 12.8 % (ref 11.5–15.5)
WBC: 8 10*3/uL (ref 4.0–10.5)

## 2016-08-10 LAB — CBG MONITORING, ED: Glucose-Capillary: 92 mg/dL (ref 65–99)

## 2016-08-10 LAB — BASIC METABOLIC PANEL
Anion gap: 8 (ref 5–15)
BUN: 12 mg/dL (ref 6–20)
CO2: 28 mmol/L (ref 22–32)
CREATININE: 1.03 mg/dL (ref 0.61–1.24)
Calcium: 9.4 mg/dL (ref 8.9–10.3)
Chloride: 103 mmol/L (ref 101–111)
GFR calc Af Amer: >60 mL/min (ref >60)
GFR calc non Af Amer: >60 mL/min (ref >60)
Glucose, Bld: 90 mg/dL (ref 65–99)
Potassium: 4.1 mmol/L (ref 3.5–5.1)
Sodium: 139 mmol/L (ref 135–145)

## 2016-08-10 MED ORDER — KETOROLAC TROMETHAMINE 30 MG/ML IJ SOLN
30.0000 mg | Freq: Once | INTRAMUSCULAR | Status: AC
Start: 2016-08-10 — End: 2016-08-10
  Administered 2016-08-10: 30 mg via INTRAVENOUS
  Filled 2016-08-10: qty 1

## 2016-08-10 MED ORDER — LAMOTRIGINE 100 MG PO TABS: 100.0000 mg | ORAL_TABLET | Freq: Every day | ORAL | 0 refills | Status: DC

## 2016-08-10 MED ORDER — FENTANYL CITRATE (PF) 100 MCG/2ML IJ SOLN
50.0000 ug | Freq: Once | INTRAMUSCULAR | Status: AC
  Administered 2016-08-10: 50 ug via INTRAVENOUS
  Filled 2016-08-10: qty 2
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
## 2016-08-10 NOTE — ED Notes (Signed)
Pt waiting for friend to bring clothes before discharge

## 2016-08-10 NOTE — Discharge Instructions (Signed)
It is imperative that you take your antiseizure medications as we discussed and follow-up with neurology. It is also imperative do not drive or operate machinery until you are seen by neurology. You were given resources by case management to help with obtaining your medication. Return to ED for any new or worsening symptoms as we discussed.

## 2016-08-10 NOTE — ED Notes (Signed)
Removed pt's C collar per verbal order from PA
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
## 2016-08-18 ENCOUNTER — Emergency Department (HOSPITAL_COMMUNITY)
Admission: EM | Admit: 2016-08-18 | Discharge: 2016-08-18 | Disposition: A | Discharge status: Home / Self Care | Payer: No Typology Code available for payment source | Attending: Emergency Medicine | Admitting: Emergency Medicine

## 2016-08-18 ENCOUNTER — Encounter (HOSPITAL_COMMUNITY): Payer: Self-pay

## 2016-08-18 DIAGNOSIS — Z5321 Procedure and treatment not carried out due to patient leaving prior to being seen by health care provider: Secondary | ICD-10-CM | POA: Insufficient documentation

## 2016-08-18 DIAGNOSIS — R569 Unspecified convulsions: Secondary | ICD-10-CM | POA: Insufficient documentation
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
## 2016-08-18 NOTE — ED Notes (Signed)
Patient states he wants to leave. Patient ripped monitor leads off and put clothes on. Patient was verbally aggressive towards, Dorisann Framesonia, RN and [place], Charity fundraiserN. Writer went and got the patient's brother. Patient then proceeded to leave after IV discontinued.

## 2016-08-18 NOTE — ED Provider Notes (Signed)
Blood pressure 122/73, pulse 99, temperature 98.2 F (36.8 C), temperature source Oral, resp. rate 12, height 6\' 1"  (1.854 m), weight 96.2 kg, SpO2 96 %.  Sheliah HatchJames T Salehi is a 31 y.o. male in by EMS for seizure. I was informed by nursing staff that this patient has eloped, I was not able to evaluate this patient and I did not participate in his care.   Wynetta Emeryicole Iwao Shamblin, PA-C 08/18/16 1441    Rolland PorterMark Jerrion, MD 08/28/16 [phone]

## 2016-08-18 NOTE — ED Notes (Signed)
Bed: [id number] Expected date:  Expected time:  Means of arrival:  Comments: 31 yo seizure

## 2017-02-02 ENCOUNTER — Encounter (HOSPITAL_COMMUNITY): Payer: Self-pay | Admitting: Nurse Practitioner

## 2017-02-02 ENCOUNTER — Emergency Department (HOSPITAL_COMMUNITY): Payer: Self-pay

## 2017-02-02 ENCOUNTER — Emergency Department (HOSPITAL_COMMUNITY)
Admission: EM | Admit: 2017-02-02 | Discharge: 2017-02-02 | Disposition: A | Discharge status: Home / Self Care | Payer: Self-pay | Attending: Emergency Medicine | Admitting: Emergency Medicine

## 2017-02-02 DIAGNOSIS — R569 Unspecified convulsions: Secondary | ICD-10-CM | POA: Insufficient documentation

## 2017-02-02 DIAGNOSIS — Z79899 Other long term (current) drug therapy: Secondary | ICD-10-CM | POA: Insufficient documentation

## 2017-02-02 LAB — BASIC METABOLIC PANEL
Anion gap: 11 (ref 5–15)
BUN: 14 mg/dL (ref 6–20)
CO2: 24 mmol/L (ref 22–32)
Calcium: 9.4 mg/dL (ref 8.9–10.3)
Chloride: 105 mmol/L (ref 101–111)
Creatinine, Ser: 1.13 mg/dL (ref 0.61–1.24)
GFR calc Af Amer: >60 mL/min (ref >60)
GFR calc non Af Amer: >60 mL/min (ref >60)
Glucose, Bld: 117 mg/dL — ABNORMAL HIGH (ref 65–99)
Potassium: 4.3 mmol/L (ref 3.5–5.1)
Sodium: 140 mmol/L (ref 135–145)

## 2017-02-02 MED ORDER — LEVETIRACETAM 500 MG PO TABS: 500.0000 mg | ORAL_TABLET | Freq: Two times a day (BID) | ORAL | 0 refills | Status: DC

## 2017-02-02 MED ORDER — LEVETIRACETAM 500 MG PO TABS
500.0000 mg | ORAL_TABLET | Freq: Once | ORAL | Status: AC
  Administered 2017-02-02: 500 mg via ORAL
  Filled 2017-02-02: qty 1
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
## 2017-02-02 NOTE — ED Notes (Signed)
Wounds on elbow and head covered with bacitracin and elbow was wrapped with gauze and non adherent dressing.

## 2017-02-02 NOTE — Discharge Instructions (Signed)
Medications: Keppra  Treatment: Begin taking Keppra twice daily. Do not drive, operate machinery, swim, or climb to high heights until 6 months of seizure free. This is unsafe to yourself and those around you. Apply an about an appointment to your elbow twice daily after washing with warm soapy water. Beware of signs of infection including fever, increasing pain, redness, swelling, drainage. Please return to the emergency department if you develop any of these symptoms.  Follow-up: Please follow-up with a neurologist, either [facility], or [facility], for further evaluation and treatment of your seizures. Please return to the emergency department if you develop any new or worsening symptoms.

## 2017-05-21 ENCOUNTER — Emergency Department (HOSPITAL_BASED_OUTPATIENT_CLINIC_OR_DEPARTMENT_OTHER)
Admission: EM | Admit: 2017-05-21 | Discharge: 2017-05-22 | Disposition: A | Discharge status: Home / Self Care | Payer: Self-pay | Attending: Emergency Medicine | Admitting: Emergency Medicine

## 2017-05-21 DIAGNOSIS — Y929 Unspecified place or not applicable: Secondary | ICD-10-CM | POA: Insufficient documentation

## 2017-05-21 DIAGNOSIS — X501XXA Overexertion from prolonged static or awkward postures, initial encounter: Secondary | ICD-10-CM | POA: Insufficient documentation

## 2017-05-21 DIAGNOSIS — Y999 Unspecified external cause status: Secondary | ICD-10-CM | POA: Insufficient documentation

## 2017-05-21 DIAGNOSIS — Y939 Activity, unspecified: Secondary | ICD-10-CM | POA: Insufficient documentation

## 2017-05-21 DIAGNOSIS — S93401A Sprain of unspecified ligament of right ankle, initial encounter: Secondary | ICD-10-CM | POA: Insufficient documentation

## 2017-05-22 ENCOUNTER — Other Ambulatory Visit: Payer: Self-pay

## 2017-05-22 ENCOUNTER — Emergency Department (HOSPITAL_BASED_OUTPATIENT_CLINIC_OR_DEPARTMENT_OTHER): Payer: Self-pay

## 2017-05-22 ENCOUNTER — Encounter (HOSPITAL_BASED_OUTPATIENT_CLINIC_OR_DEPARTMENT_OTHER): Payer: Self-pay | Admitting: Emergency Medicine

## 2017-05-22 MED ORDER — NAPROXEN 500 MG PO TABS: 500.0000 mg | ORAL_TABLET | Freq: Two times a day (BID) | ORAL | 0 refills | Status: DC

## 2017-05-22 NOTE — ED Notes (Signed)
Patient transported to X-ray 

## 2017-05-22 NOTE — ED Provider Notes (Signed)
[facility] EMERGENCY DEPARTMENT Provider Note   CSN: [id number] Arrival date & time: 05/21/17  2356     History   Chief Complaint Chief Complaint  Patient presents with  . Ankle Pain    HPI Paul Dorsey is a 31 y.o. male.  HPI  This is a 31 year old male who presents with right ankle pain.  Patient reports injury approximately 12 days ago while playing dodgeball.  He thought he had a sprain.  He has used ankle brace and ibuprofen.  He states that times he has no pain but he continues to have dull 3-4 out of 10 pain mostly with ambulation.  He has noted some swelling.  He has not been icing or elevating his ankle since 2-3 days after injury.  Denies any knee pain or other injury.  Past Medical History:  Diagnosis Date  . JME (juvenile myoclonic epilepsy) (HCC)   . Seizure [facility](HCC)     Patient Active Problem List   Diagnosis Date Noted  . Dehydration 12/31/2011  . Seizures (HCC) 12/31/2011  . Hypotension 12/31/2011  . Tachycardia 12/31/2011    Past Surgical History:  Procedure Laterality Date  . ABDOMINAL SURGERY    . ELBOW SURGERY    . FRACTURE SURGERY    . pyloric stenosis surgery    . SHOULDER SURGERY Right        Home Medications    Prior to Admission medications   Medication Sig Start Date End Date Taking? Authorizing Provider  cyclobenzaprine (FLEXERIL) 10 MG tablet Take 1 tablet (10 mg total) by mouth 2 (two) times daily as needed for muscle spasms. Patient not taking: Reported on 08/10/2016 08/06/16   Hedges, Tinnie GensJeffrey, PA-C  lamoTRIgine (LAMICTAL) 100 MG tablet Take 1 tablet (100 mg total) by mouth daily. 1/2 tablet bid for 7 days, followed by 1 tablet bid for 7 fays, followed by 1.5 tablets bid for 7 days, followed by 2 tablets bid for 7 days Patient not taking: Reported on 02/02/2017 08/10/16   Joycie Peekartner, Benjamin, PA-C  levETIRAcetam (KEPPRA) 500 MG tablet Take 1 tablet (500 mg total) by mouth 2 (two) times daily. 02/02/17   Law, Waylan BogaAlexandra M, PA-C    naproxen (NAPROSYN) 500 MG tablet Take 1 tablet (500 mg total) by mouth 2 (two) times daily. Patient not taking: Reported on 08/10/2016 09/14/15   Glynn Octaveancour, Stephen, MD  naproxen (NAPROSYN) 500 MG tablet Take 1 tablet (500 mg total) 2 (two) times daily by mouth. 05/22/17   Horton, Mayer Maskerourtney F, MD    Family History No family history on file.  Social History Social History   Tobacco Use  . Smoking status: Never Smoker  . Smokeless tobacco: Never Used  Substance Use Topics  . Alcohol use: No    Alcohol/week: 1.8 oz    Types: 3 Cans of beer per week    Comment: occ  . Drug use: No     Allergies   Clarithromycin; Dye fdc red [red dye]; and Oxycodone   Review of Systems Review of Systems  Musculoskeletal:       Right ankle pain  Skin: Negative for color change and wound.  All other systems reviewed and are negative.    Physical Exam Updated Vital Signs BP 138/88 (BP Location: Left Arm)   Pulse 78   Temp 98.8 F (37.1 C) (Oral)   Resp 18   SpO2 100%   Physical Exam  Constitutional: He is oriented to person, place, and time. He appears well-developed and well-nourished.  HENT:  Head: Normocephalic and atraumatic.  Cardiovascular: Normal rate and regular rhythm.  Pulmonary/Chest: Effort normal. No respiratory distress.  Musculoskeletal: He exhibits no edema.  Focused examination of the right ankle reveals normal range of motion, no significant swelling, mild tenderness palpation over the medial malleolus, no obvious deformities, 2+ DP pulse, no overlying skin changes  Neurological: He is alert and oriented to person, place, and time.  Skin: Skin is warm and dry.  Psychiatric: He has a normal mood and affect.  Nursing note and vitals reviewed.    ED Treatments / Results  Labs (all labs ordered are listed, but only abnormal results are displayed) Labs Reviewed - No data to display  EKG  EKG Interpretation None       Radiology Dg Ankle Complete  Right  Result Date: 05/22/2017 CLINICAL DATA:  31 y/o M; rolling injury of right ankle 12 days ago with pain, swelling, and discoloration. EXAM: RIGHT ANKLE - COMPLETE 3+ VIEW COMPARISON:  None. FINDINGS: No acute fracture or dislocation. Talar dome is intact. Ankle mortise is symmetric. Lower fibula diaphysis cortical well-circumscribed sclerotic focus, likely benign fibrous lesion. IMPRESSION: No acute fracture or dislocation identified. Electronically Signed   By: Mitzi HansenLance  Furusawa-Stratton M.D.   On: 05/22/2017 00:45    Procedures Procedures (including critical care time)  Medications Ordered in ED Medications - No data to display   Initial Impression / Assessment and Plan / ED Course  I have reviewed the triage vital signs and the nursing notes.  Pertinent labs & imaging results that were available during my care of the patient were reviewed by me and considered in my medical decision making (see chart for details).     Patient presents with ankle injury approximately 2 weeks ago.  Persistent dull pain worse with ambulation.  Suspect sequelae of ankle sprain.  No indication of fracture on x-ray.  Recommend continued anti-inflammatories and support.  Ice and elevate when resting.  After history, exam, and medical workup I feel the patient has been appropriately medically screened and is safe for discharge home. Pertinent diagnoses were discussed with the patient. Patient was given return precautions.   Final Clinical Impressions(s) / ED Diagnoses   Final diagnoses:  Sprain of right ankle, unspecified ligament, initial encounter    ED Discharge Orders        Ordered    naproxen (NAPROSYN) 500 MG tablet  2 times daily     05/22/17 0049       Horton, Mayer Maskerourtney F, MD 05/22/17 [phone]

## 2017-05-22 NOTE — ED Notes (Signed)
Pt discharged to home NAD.  

## 2017-05-22 NOTE — ED Triage Notes (Addendum)
PT presents with c/o right ankle pain from playing dodge ball at [employer], pt reports pain for 12 days. Pt reports taking OTC nsaids and bought an ace wrap, but nothing has helped.

## 2017-07-24 ENCOUNTER — Emergency Department (HOSPITAL_COMMUNITY)
Admission: EM | Admit: 2017-07-24 | Discharge: 2017-07-24 | Disposition: A | Discharge status: Home / Self Care | Payer: Self-pay | Attending: Emergency Medicine | Admitting: Emergency Medicine

## 2017-07-24 ENCOUNTER — Emergency Department (HOSPITAL_COMMUNITY): Payer: Self-pay

## 2017-07-24 DIAGNOSIS — M25511 Pain in right shoulder: Secondary | ICD-10-CM | POA: Insufficient documentation

## 2017-07-24 DIAGNOSIS — Y9289 Other specified places as the place of occurrence of the external cause: Secondary | ICD-10-CM | POA: Insufficient documentation

## 2017-07-24 DIAGNOSIS — S0083XA Contusion of other part of head, initial encounter: Secondary | ICD-10-CM | POA: Insufficient documentation

## 2017-07-24 DIAGNOSIS — I959 Hypotension, unspecified: Secondary | ICD-10-CM | POA: Insufficient documentation

## 2017-07-24 DIAGNOSIS — Y990 Civilian activity done for income or pay: Secondary | ICD-10-CM | POA: Insufficient documentation

## 2017-07-24 DIAGNOSIS — W010XXA Fall on same level from slipping, tripping and stumbling without subsequent striking against object, initial encounter: Secondary | ICD-10-CM | POA: Insufficient documentation

## 2017-07-24 DIAGNOSIS — R569 Unspecified convulsions: Secondary | ICD-10-CM | POA: Insufficient documentation

## 2017-07-24 DIAGNOSIS — Y9389 Activity, other specified: Secondary | ICD-10-CM | POA: Insufficient documentation

## 2017-07-24 LAB — COMPREHENSIVE METABOLIC PANEL
ALBUMIN: 4.2 g/dL (ref 3.5–5.0)
ALK PHOS: 53 U/L (ref 38–126)
ALT: 48 U/L (ref 17–63)
AST: 35 U/L (ref 15–41)
Anion gap: 7 (ref 5–15)
BILIRUBIN TOTAL: 0.6 mg/dL (ref 0.3–1.2)
BUN: 13 mg/dL (ref 6–20)
CALCIUM: 9.4 mg/dL (ref 8.9–10.3)
CO2: 27 mmol/L (ref 22–32)
CREATININE: 1.1 mg/dL (ref 0.61–1.24)
Chloride: 105 mmol/L (ref 101–111)
GFR calc Af Amer: >60 mL/min (ref >60)
GFR calc non Af Amer: >60 mL/min (ref >60)
GLUCOSE: 96 mg/dL (ref 65–99)
Potassium: 4.1 mmol/L (ref 3.5–5.1)
Sodium: 139 mmol/L (ref 135–145)
TOTAL PROTEIN: 6.8 g/dL (ref 6.5–8.1)

## 2017-07-24 LAB — CBC WITH DIFFERENTIAL/PLATELET
BASOS ABS: 0 10*3/uL (ref 0.0–0.1)
Basophils Relative: 0 %
Eosinophils Absolute: 0.1 10*3/uL (ref 0.0–0.7)
Eosinophils Relative: 1 %
HEMATOCRIT: 47 % (ref 39.0–52.0)
HEMOGLOBIN: 16.3 g/dL (ref 13.0–17.0)
LYMPHS PCT: 14 %
Lymphs Abs: 1.2 10*3/uL (ref 0.7–4.0)
MCH: 31.5 pg (ref 26.0–34.0)
MCHC: 34.7 g/dL (ref 30.0–36.0)
MCV: 90.9 fL (ref 78.0–100.0)
Monocytes Absolute: 0.6 10*3/uL (ref 0.1–1.0)
Monocytes Relative: 8 %
NEUTROS ABS: 6.4 10*3/uL (ref 1.7–7.7)
NEUTROS PCT: 77 %
PLATELETS: 269 10*3/uL (ref 150–400)
RBC: 5.17 MIL/uL (ref 4.22–5.81)
RDW: 12.7 % (ref 11.5–15.5)
WBC: 8.3 10*3/uL (ref 4.0–10.5)

## 2017-07-24 LAB — URINALYSIS, ROUTINE W REFLEX MICROSCOPIC
BILIRUBIN URINE: NEGATIVE
Glucose, UA: NEGATIVE mg/dL
Hgb urine dipstick: NEGATIVE
Ketones, ur: NEGATIVE mg/dL
LEUKOCYTES UA: NEGATIVE
NITRITE: NEGATIVE
PH: 6 (ref 5.0–8.0)
Protein, ur: NEGATIVE mg/dL
SPECIFIC GRAVITY, URINE: 1.021 (ref 1.005–1.030)

## 2017-07-24 LAB — CBG MONITORING, ED: Glucose-Capillary: 91 mg/dL (ref 65–99)

## 2017-07-24 LAB — RAPID URINE DRUG SCREEN, HOSP PERFORMED
AMPHETAMINES: NOT DETECTED
Barbiturates: NOT DETECTED
Benzodiazepines: NOT DETECTED
Cocaine: NOT DETECTED
Opiates: NOT DETECTED
Tetrahydrocannabinol: NOT DETECTED

## 2017-07-24 MED ORDER — LEVETIRACETAM 500 MG PO TABS: 500.0000 mg | ORAL_TABLET | Freq: Two times a day (BID) | ORAL | 0 refills | Status: DC

## 2017-07-24 MED ORDER — LEVETIRACETAM 500 MG/5ML IV SOLN
1000.0000 mg | Freq: Once | INTRAVENOUS | Status: AC
  Administered 2017-07-24: 1000 mg via INTRAVENOUS
  Filled 2017-07-24: qty 10

## 2017-07-24 NOTE — ED Notes (Signed)
Patient transported to X-ray 

## 2017-07-24 NOTE — Discharge Instructions (Signed)
Use ice on the sore spots today and tomorrow.  Keep the lip wound clean with soap and water once or twice a day.  Start taking the prescription for seizures, tomorrow.  Make sure that you follow-up with a doctor at the community wellness center for ongoing management of your seizure disorder.

## 2017-07-24 NOTE — Care Management Note (Signed)
Case Management Note  CM consulted for no ins and no pcp with needs for medication assistance.  [facility] AVS for time of D/C, including pharmacy and financial counseling information.  Spoke with Dr. Effie ShyWentz and advised him to have pt walk across the street to the pharmacy on D/C before 6 pm for medication assistance.  No further CM needs noted at this time.

## 2017-07-24 NOTE — ED Notes (Signed)
No urine output

## 2017-07-24 NOTE — ED Notes (Signed)
Patient given discharge instructions and verbalized understanding.  Patient stable to discharge at this time.  Patient is alert and oriented to baseline.  No distressed noted at this time.  All belongings taken with the patient at discharge.   

## 2017-07-24 NOTE — ED Triage Notes (Addendum)
Per EMS at work pushing a cart and started to have a seizure and fell on right side injuring his bottom lip, right shoulder, and left elbow.  By the time EMS arrived he was AOx4.  He reports that while getting dressed this morning it was hard for him to hold onto things without dropping them.  AOx4 NAD noted at this time.

## 2017-07-24 NOTE — ED Notes (Signed)
EDP at bedside  

## 2017-07-24 NOTE — ED Notes (Signed)
Urinal at bedside. Pt states he has not had anything to drink therefore believes he may not be able to provide urine sample. Pt urged to try. Will check back later.

## 2017-07-24 NOTE — ED Provider Notes (Signed)
[facility] EMERGENCY DEPARTMENT Provider Note   CSN: [id number] Arrival date & time: 07/24/17  1217     History   Chief Complaint Chief Complaint  Patient presents with  . Seizures    HPI Paul Dorsey is a 32 y.o. male. He presents for evaluation of seizure.  Patient was at work was feeling somewhat sleepy, and was walking pushing a cart containing chairs when he suddenly stiffened, and had a generalized seizure.  According to bystanders he shook for about 3 minutes, then was postictal for 5 minutes, according to EMS.  On arrival in the ED he is alert, and complains of pain in his right face, right shoulder, and generalized achiness.  He denies other prodrome including recent illness.  He stopped taking Lamictal about 5 years ago, after a workup for seizures was negative.  He denies seizures in the interim.  He drinks alcohol socially, but does not use any illegal drugs.  No recent head trauma.  There are no other known modifying factors.  HPI  Past Medical History:  Diagnosis Date  . JME (juvenile myoclonic epilepsy) (HCC)   . Seizure [facility])     Patient Active Problem List   Diagnosis Date Noted  . Dehydration 12/31/2011  . Seizures (HCC) 12/31/2011  . Hypotension 12/31/2011  . Tachycardia 12/31/2011    Past Surgical History:  Procedure Laterality Date  . ABDOMINAL SURGERY    . ELBOW SURGERY    . FRACTURE SURGERY    . pyloric stenosis surgery    . SHOULDER SURGERY Right        Home Medications    Prior to Admission medications   Medication Sig Start Date End Date Taking? Authorizing Provider  cyclobenzaprine (FLEXERIL) 10 MG tablet Take 1 tablet (10 mg total) by mouth 2 (two) times daily as needed for muscle spasms. Patient not taking: Reported on 08/10/2016 08/06/16   Hedges, Tinnie Gens, PA-C  levETIRAcetam (KEPPRA) 500 MG tablet Take 1 tablet (500 mg total) by mouth 2 (two) times daily. 07/24/17   Mancel Bale, MD  naproxen (NAPROSYN) 500 MG  tablet Take 1 tablet (500 mg total) by mouth 2 (two) times daily. Patient not taking: Reported on 08/10/2016 09/14/15   Glynn Octave, MD  naproxen (NAPROSYN) 500 MG tablet Take 1 tablet (500 mg total) 2 (two) times daily by mouth. Patient not taking: Reported on 07/24/2017 05/22/17   Horton, Mayer Masker, MD    Family History No family history on file.  Social History Social History   Tobacco Use  . Smoking status: Never Smoker  . Smokeless tobacco: Never Used  Substance Use Topics  . Alcohol use: No    Alcohol/week: 1.8 oz    Types: 3 Cans of beer per week    Comment: occ  . Drug use: No     Allergies   Clarithromycin; Dye fdc red [red dye]; and Oxycodone   Review of Systems Review of Systems   Physical Exam Updated Vital Signs BP 112/60   Pulse 68   Temp 98.4 F (36.9 C) (Oral)   Resp (!) 9   Ht 6\' 1"  (1.854 m)   Wt 101.6 kg (224 lb)   SpO2 97%   BMI 29.55 kg/m   Physical Exam  Constitutional: He is oriented to person, place, and time. He appears well-developed and well-nourished. No distress.  HENT:  Head: Normocephalic.  Right Ear: External ear normal.  Left Ear: External ear normal.  Mild contusion right frontal region, right  cheek, and right lower lip.  Abrasion right lower lip.No visible dental trauma.  Eyes: Conjunctivae and EOM are normal. Pupils are equal, round, and reactive to light.  Neck: Normal range of motion and phonation normal. Neck supple.  Cardiovascular: Normal rate, regular rhythm and normal heart sounds.  Pulmonary/Chest: Effort normal and breath sounds normal. He exhibits no bony tenderness.  Abdominal: Soft. There is no tenderness.  Musculoskeletal: Normal range of motion.  Neurological: He is alert and oriented to person, place, and time. No cranial nerve deficit or sensory deficit. He exhibits normal muscle tone. Coordination normal.  No dysarthria, aphasia or nystagmus.  Skin: Skin is warm, dry and intact.  Psychiatric: He has a  normal mood and affect. His behavior is normal. Judgment and thought content normal.  Nursing note and vitals reviewed.    ED Treatments / Results  Labs (all labs ordered are listed, but only abnormal results are displayed) Labs Reviewed  RAPID URINE DRUG SCREEN, HOSP PERFORMED  URINALYSIS, ROUTINE W REFLEX MICROSCOPIC  COMPREHENSIVE METABOLIC PANEL  CBC WITH DIFFERENTIAL/PLATELET  CBG MONITORING, ED    EKG  EKG Interpretation  Date/Time:  Monday July 24 2017 12:29:08 EST Ventricular Rate:  86 PR Interval:    QRS Duration: 99 QT Interval:  348 QTC Calculation: 417 R Axis:   93 Text Interpretation:  Sinus rhythm Borderline right axis deviation ST elev, probable normal early repol pattern Baseline wander in lead(s) V3 since last tracing no significant change Confirmed by Mancel Bale [phone]) on 07/24/2017 3:42:46 PM       Radiology Dg Shoulder Right  Result Date: 07/24/2017 CLINICAL DATA:  RIGHT arm pain following seizure EXAM: RIGHT SHOULDER - 2+ VIEW COMPARISON:  None. FINDINGS: Glenohumeral joint is intact. No evidence of scapular fracture or humeral fracture. The acromioclavicular joint is intact. IMPRESSION: No fracture or dislocation. Electronically Signed   By: Genevive Bi M.D.   On: 07/24/2017 17:21    Procedures Procedures (including critical care time)  Medications Ordered in ED Medications  levETIRAcetam (KEPPRA) 1,000 mg in sodium chloride 0.9 % 100 mL IVPB (0 mg Intravenous Stopped 07/24/17 1655)     Initial Impression / Assessment and Plan / ED Course  I have reviewed the triage vital signs and the nursing notes.  Pertinent labs & imaging results that were available during my care of the patient were reviewed by me and considered in my medical decision making (see chart for details).      Patient Vitals for the past 24 hrs:  BP Temp Temp src Pulse Resp SpO2 Height Weight  07/24/17 1630 112/60 - - 68 (!) 9 97 % - -  07/24/17 1615 118/70 - -  73 13 97 % - -  07/24/17 1600 112/74 - - 76 13 97 % - -  07/24/17 1545 113/86 - - 87 14 99 % - -  07/24/17 1530 117/79 - - 71 15 98 % - -  07/24/17 1515 119/73 - - 80 16 96 % - -  07/24/17 1500 120/74 - - 75 15 96 % - -  07/24/17 1445 120/76 - - 70 15 97 % - -  07/24/17 1430 124/80 - - 81 17 99 % - -  07/24/17 1415 129/88 - - 90 18 97 % - -  07/24/17 1400 131/83 - - 77 13 98 % - -  07/24/17 1345 122/83 - - 81 16 98 % - -  07/24/17 1330 125/84 - - 79 17 99 % - -  07/24/17 1315 127/86 - - 85 19 100 % - -  07/24/17 1300 136/90 - - 81 16 100 % - -  07/24/17 1245 129/85 - - 83 13 99 % - -  07/24/17 1231 - - - - - - 6\' 1"  (1.854 m) 101.6 kg (224 lb)  07/24/17 1230 122/85 - - 91 20 98 % - -  07/24/17 1229 124/89 98.4 F (36.9 C) Oral 86 14 99 % - -    At discharge- reevaluation with update and discussion. After initial assessment and treatment, an updated evaluation reveals no further seizures.  No further complaints.  Findings discussed with the patient and all questions answered. Mancel BaleElliott Arben Packman      Final Clinical Impressions(s) / ED Diagnoses   Final diagnoses:  Seizure (HCC)  Contusion of face, initial encounter    Recurrent seizure, with known epilepsy.  Patient not currently being medicated at home.  Contacted social worker who directed patient to the community wellness center for medication access, and ongoing primary care.  Nursing Notes Reviewed/ Care Coordinated Applicable Imaging Reviewed Interpretation of Laboratory Data incorporated into ED treatment  The patient appears reasonably screened and/or stabilized for discharge and I doubt any other medical condition or other [facility] requiring further screening, evaluation, or treatment in the ED at this time prior to discharge.  Plan: Home Medications-OTC analgesia as needed; Home Treatments-rest, fluids; return here if the recommended treatment, does not improve the symptoms; Recommended follow up-PCP as soon as possible for  ongoing management.   ED Discharge Orders        Ordered    levETIRAcetam (KEPPRA) 500 MG tablet  2 times daily     07/24/17 1709       Mancel BaleWentz, Anivea Velasques, MD 07/24/17 [phone]

## 2017-07-25 ENCOUNTER — Telehealth: Payer: Self-pay

## 2017-07-25 MED FILL — ?LEVETIRACETAM 500 MG TABLE: 500 | 30 days supply | Qty: 60 | Fill #0

## 2017-07-25 NOTE — Telephone Encounter (Signed)
Request received from Eldridge AbrahamsAngela Kritzer, RN CM requesting a [facility].   Call placed to # [phone] (M) as there is an appointment available tomorrow.   The  voicemail has not been set up , unable to leave a message.  The patient will need to call the clinic - [facility] and [facility] are on his AVS from the ED.  Update provided to Breck CoonsA. Kritzer, RN CM

## 2017-08-15 ENCOUNTER — Inpatient Hospital Stay: Payer: Self-pay

## 2017-08-21 ENCOUNTER — Encounter: Payer: Self-pay | Admitting: Family Medicine

## 2017-08-21 ENCOUNTER — Ambulatory Visit: Payer: Self-pay | Attending: Family Medicine | Admitting: Family Medicine

## 2017-08-21 VITALS — BP 122/73 | HR 73 | Temp 98.0°F | Resp 18 | Ht 73.0 in | Wt 230.0 lb

## 2017-08-21 DIAGNOSIS — G8929 Other chronic pain: Secondary | ICD-10-CM | POA: Insufficient documentation

## 2017-08-21 DIAGNOSIS — M25511 Pain in right shoulder: Secondary | ICD-10-CM | POA: Insufficient documentation

## 2017-08-21 DIAGNOSIS — Z9889 Other specified postprocedural states: Secondary | ICD-10-CM

## 2017-08-21 DIAGNOSIS — Z79899 Other long term (current) drug therapy: Secondary | ICD-10-CM | POA: Insufficient documentation

## 2017-08-21 DIAGNOSIS — G40909 Epilepsy, unspecified, not intractable, without status epilepticus: Secondary | ICD-10-CM | POA: Insufficient documentation

## 2017-08-21 MED ORDER — MELOXICAM 7.5 MG PO TABS: 7.5000 mg | ORAL_TABLET | Freq: Every day | ORAL | 0 refills | Status: AC | PRN

## 2017-08-21 MED ORDER — LEVETIRACETAM 500 MG PO TABS: 500.0000 mg | ORAL_TABLET | Freq: Two times a day (BID) | ORAL | 2 refills | Status: DC

## 2017-08-21 MED FILL — MELOXICAM 7.5 MG TABLET: 7.5 | 30 days supply | Qty: 30 | Fill #0

## 2017-08-21 MED FILL — ?LEVETIRACETAM 500 MG TABLE: 500 | 30 days supply | Qty: 60 | Fill #0

## 2017-08-21 NOTE — Patient Instructions (Signed)
Apply for orange card, blue card, and discount programs.    Shoulder Pain Many things can cause shoulder pain, including:  An injury to the area.  Overuse of the shoulder.  Arthritis.  The source of the pain can be:  Inflammation.  An injury to the shoulder joint.  An injury to a tendon, ligament, or bone.  Follow these instructions at home: Take these actions to help with your pain:  Squeeze a soft ball or a foam pad as much as possible. This helps to keep the shoulder from swelling. It also helps to strengthen the arm.  Take over-the-counter and prescription medicines only as told by your health care provider.  If directed, apply ice to the area: ? Put ice in a plastic bag. ? Place a towel between your skin and the bag. ? Leave the ice on for 20 minutes, 2-3 times per day. Stop applying ice if it does not help with the pain.  If you were given a shoulder sling or immobilizer: ? Wear it as told. ? Remove it to shower or bathe. ? Move your arm as little as possible, but keep your hand moving to prevent swelling.  Contact a health care provider if:  Your pain gets worse.  Your pain is not relieved with medicines.  New pain develops in your arm, hand, or fingers. Get help right away if:  Your arm, hand, or fingers: ? Tingle. ? Become numb. ? Become swollen. ? Become painful. ? Turn white or blue. This information is not intended to replace advice given to you by your health care provider. Make sure you discuss any questions you have with your health care provider. Document Released: 04/06/2005 Document Revised: 02/21/2016 Document Reviewed: 10/20/2014 Elsevier Interactive Patient Education  [employer]2018 Elsevier Inc.

## 2017-08-21 NOTE — Progress Notes (Signed)
Subjective:  Patient ID: Paul Dorsey, male    DOB: Mar 07, 1986  Age: 32 y.o. MRN: [id number]  CC: Follow-up; Shoulder Pain; and Seizures   HPI Paul HatchJames T Terada presents for to establish care. History of ED  Visit on 07/24/17 for a witnessed seizure while at work and was brought ED by EMS. Upon arrival he complained of right sided face pain, shoulder pain, and generalized muscle aches. History of seizure disorder he reports d/c Lamictal 5 years ago and starting on Keprra. He reports triggers for seizures include stress and poor diet. He reports seizure prodrome as feeling "groggy" and dropping items. He reports [facility]. He c/o chronic shoulder pain. He reports history of labrum tear with surgical procedure in 2014. Associated symptoms include decreased ROM and instability.    Outpatient Medications Prior to Visit  Medication Sig Dispense Refill  . levETIRAcetam (KEPPRA) 500 MG tablet Take 1 tablet (500 mg total) by mouth 2 (two) times daily. 60 tablet 0  . cyclobenzaprine (FLEXERIL) 10 MG tablet Take 1 tablet (10 mg total) by mouth 2 (two) times daily as needed for muscle spasms. (Patient not taking: Reported on 08/10/2016) 20 tablet 0  . naproxen (NAPROSYN) 500 MG tablet Take 1 tablet (500 mg total) by mouth 2 (two) times daily. (Patient not taking: Reported on 08/10/2016) 30 tablet 0  . naproxen (NAPROSYN) 500 MG tablet Take 1 tablet (500 mg total) 2 (two) times daily by mouth. (Patient not taking: Reported on 07/24/2017) 30 tablet 0   No facility-administered medications prior to visit.     ROS Review of Systems  Constitutional: Negative.   Respiratory: Negative.   Cardiovascular: Negative.   Musculoskeletal: Positive for arthralgias.  Neurological: Positive for seizures.  Psychiatric/Behavioral: Negative for suicidal ideas.    Objective:  BP 122/73 (BP Location: Right Arm, Patient Position: Sitting, Cuff Size: Large)   Pulse 73    Temp 98 F (36.7 C) (Oral)   Resp 18   Ht 6\' 1"  (1.854 m)   Wt 230 lb (104.3 kg)   SpO2 99%   BMI 30.34 kg/m   BP/Weight 08/21/2017 07/24/2017 05/22/2017  Systolic BP 122 112 138  Diastolic BP 73 60 88  Wt. (Lbs) 230 224 -  BMI 30.34 29.55 -     Physical Exam  Constitutional: He is oriented to person, place, and time. He appears well-developed and well-nourished.  HENT:  Head: Normocephalic and atraumatic.  Right Ear: External ear normal.  Left Ear: External ear normal.  Nose: Nose normal.  Mouth/Throat: Oropharynx is clear and moist.  Eyes: Conjunctivae are normal. Pupils are equal, round, and reactive to light.  Neck: No JVD present.  Cardiovascular: Normal rate, regular rhythm, normal heart sounds and intact distal pulses.  Pulmonary/Chest: Effort normal and breath sounds normal.  Abdominal: Soft. Bowel sounds are normal.  Musculoskeletal:       Right shoulder: He exhibits decreased range of motion (with hyperextension) and pain.  Neurological: He is alert and oriented to person, place, and time.  Skin: Skin is warm and dry.  Nursing note and vitals reviewed.    Assessment & Plan:   1. Chronic right shoulder pain Apply for orange card and discount programs. - Ambulatory referral to Orthopedics - meloxicam (MOBIC) 7.5 MG tablet; Take 1 tablet (7.5 mg total) by mouth daily as needed for pain.  Dispense: 40 tablet; Refill: 0  2. History of shoulder surgery  - Ambulatory referral to  Orthopedics - meloxicam (MOBIC) 7.5 MG tablet; Take 1 tablet (7.5 mg total) by mouth daily as needed for pain.  Dispense: 40 tablet; Refill: 0  3. Seizure disorder (HCC)  - levETIRAcetam (KEPPRA) 500 MG tablet; Take 1 tablet (500 mg total) by mouth 2 (two) times daily.  Dispense: 60 tablet; Refill: 2 - Ambulatory referral to Neurology      Follow-up: Return if symptoms worsen or fail to improve.   Lizbeth Bark FNP

## 2017-08-21 NOTE — Progress Notes (Signed)
Emergency room F/U Sz  C/C shoulder "popping out of place" Since Sz  No pain today

## 2017-08-30 ENCOUNTER — Ambulatory Visit: Payer: Self-pay

## 2017-09-24 ENCOUNTER — Emergency Department (HOSPITAL_COMMUNITY)
Admission: EM | Admit: 2017-09-24 | Discharge: 2017-09-24 | Disposition: A | Discharge status: Home / Self Care | Payer: Self-pay | Attending: Emergency Medicine | Admitting: Emergency Medicine

## 2017-09-24 ENCOUNTER — Encounter (HOSPITAL_COMMUNITY): Payer: Self-pay | Admitting: Emergency Medicine

## 2017-09-24 ENCOUNTER — Other Ambulatory Visit: Payer: Self-pay

## 2017-09-24 DIAGNOSIS — R569 Unspecified convulsions: Secondary | ICD-10-CM

## 2017-09-24 DIAGNOSIS — Z79899 Other long term (current) drug therapy: Secondary | ICD-10-CM | POA: Insufficient documentation

## 2017-09-24 DIAGNOSIS — G40909 Epilepsy, unspecified, not intractable, without status epilepticus: Secondary | ICD-10-CM | POA: Insufficient documentation

## 2017-09-24 NOTE — ED Triage Notes (Signed)
Patient arrived to ED via [facility] from [facility]. EMS reports:  [facility]. No fall - caught by staff. EMS called. Patient disoriented at first, but AOx4 now. Denies pain. Reports nausea. BP 128/82, Pulse 100. CBG 107. Hx - seizures. Reports he has been compliant with medications.

## 2017-09-24 NOTE — Discharge Instructions (Signed)
Return here as needed.  You will need to see your doctor soon as possible.

## 2017-09-24 NOTE — ED Notes (Signed)
Burna MortimerWanda, SW, at bedside at this time.

## 2017-09-25 ENCOUNTER — Telehealth: Payer: Self-pay

## 2017-09-25 NOTE — Telephone Encounter (Signed)
Message received from Michel BickersWandalyn Rogers, RN CM requesting a [facility].  Call placed to the patient at # [phone] (M) and left a HIPAA compliant voicemail message requesting a call back to # [phone].   Update provided to Beecher McardleW. Rogers, RN CM

## 2017-09-29 ENCOUNTER — Telehealth: Payer: Self-pay | Admitting: Family Medicine

## 2017-09-29 MED FILL — ?LEVETIRACETAM 500 MG TABLE: 500 | 30 days supply | Qty: 60 | Fill #1

## 2017-09-29 NOTE — Telephone Encounter (Signed)
Call placed to patient #[phone], to [facility] ([facility] No answer. Left patient a message asking him to return my call at [phone].

## 2017-09-30 NOTE — ED Provider Notes (Signed)
[facility] EMERGENCY DEPARTMENT Provider Note   CSN: [id number] Arrival date & time: 09/24/17  1106     History   Chief Complaint Chief Complaint  Patient presents with  . Seizures    HPI OVA MEEGAN is a 32 y.o. male.  HPI Patient presents to the emergency department with a seizure just prior to arrival.  The patient states that he has been under a lot of stress and those seem to induce his seizures more readily.  The patient states he has been taking his medications.  Patient states nothing seemed to make the condition better or worse.  Patient states that at this time he feels the symptoms and would like to be discharged.  The patient denies chest pain, shortness of breath, headache,blurred vision, neck pain, fever, cough, weakness, numbness, dizziness, anorexia, edema, abdominal pain, nausea, vomiting, diarrhea, rash, back pain, dysuria, hematemesis, bloody stool, near syncope, or syncope. Past Medical History:  Diagnosis Date  . JME (juvenile myoclonic epilepsy) (HCC)   . Seizure [facility])     Patient Active Problem List   Diagnosis Date Noted  . Dehydration 12/31/2011  . Seizures (HCC) 12/31/2011  . Hypotension 12/31/2011  . Tachycardia 12/31/2011    Past Surgical History:  Procedure Laterality Date  . ABDOMINAL SURGERY    . ELBOW SURGERY    . FRACTURE SURGERY    . pyloric stenosis surgery    . SHOULDER SURGERY Right         Home Medications    Prior to Admission medications   Medication Sig Start Date End Date Taking? Authorizing Provider  cyclobenzaprine (FLEXERIL) 10 MG tablet Take 1 tablet (10 mg total) by mouth 2 (two) times daily as needed for muscle spasms. Patient not taking: Reported on 08/10/2016 08/06/16   Hedges, Tinnie Gens, PA-C  levETIRAcetam (KEPPRA) 500 MG tablet Take 1 tablet (500 mg total) by mouth 2 (two) times daily. 08/21/17   Lizbeth Bark, FNP  meloxicam (MOBIC) 7.5 MG tablet Take 1 tablet (7.5 mg total) by mouth  daily as needed for pain. 08/21/17   Lizbeth Bark, FNP    Family History No family history on file.  Social History Social History   Tobacco Use  . Smoking status: Never Smoker  . Smokeless tobacco: Never Used  Substance Use Topics  . Alcohol use: No    Alcohol/week: 1.8 oz    Types: 3 Cans of beer per week    Comment: occ  . Drug use: No     Allergies   Clarithromycin; Dye fdc red [red dye]; and Oxycodone   Review of Systems Review of Systems  All other systems negative except as documented in the HPI. All pertinent positives and negatives as reviewed in the HPI. Physical Exam Updated Vital Signs BP 129/86   Pulse 74   Temp 98.1 F (36.7 C) (Oral)   Resp 15   Ht 6\' 1"  (1.854 m)   Wt 99.8 kg (220 lb)   SpO2 100%   BMI 29.03 kg/m   Physical Exam  Constitutional: He is oriented to person, place, and time. He appears well-developed and well-nourished. No distress.  HENT:  Head: Normocephalic and atraumatic.  Mouth/Throat: Oropharynx is clear and moist.  Eyes: Pupils are equal, round, and reactive to light.  Neck: Normal range of motion. Neck supple.  Cardiovascular: Normal rate, regular rhythm and normal heart sounds. Exam reveals no gallop and no friction rub.  No murmur heard. Pulmonary/Chest: Effort normal and breath  sounds normal. No respiratory distress. He has no wheezes.  Abdominal: Soft. Bowel sounds are normal. He exhibits no distension. There is no tenderness.  Neurological: He is alert and oriented to person, place, and time. No sensory deficit. He exhibits normal muscle tone. Coordination normal.  Skin: Skin is warm and dry. Capillary refill takes less than 2 seconds. No rash noted. No erythema.  Psychiatric: He has a normal mood and affect. His behavior is normal.  Nursing note and vitals reviewed.    ED Treatments / Results  Labs (all labs ordered are listed, but only abnormal results are displayed) Labs Reviewed - No data to  display  EKG None  Radiology No results found.  Procedures Procedures (including critical care time)  Medications Ordered in ED Medications - No data to display   Initial Impression / Assessment and Plan / ED Course  I have reviewed the triage vital signs and the nursing notes.  Pertinent labs & imaging results that were available during my care of the patient were reviewed by me and considered in my medical decision making (see chart for details).     Patient is back to his baseline and is adamant about being discharged to the fact that his ride has to be at work.  The patient is at his normal baseline and is mentating appropriately.  Patient be discharged home I did advise him he will need to see his neurologist.  The patient agrees the plan and all questions were answered.  I did get him an appointment through the case manager.  Final Clinical Impressions(s) / ED Diagnoses   Final diagnoses:  Seizure [facility](HCC)  Seizure disorder [facility](HCC)    ED Discharge Orders    None       Charlestine NightLawyer, Ahmya Bernick, PA-C 09/30/17 0118    Loren RacerYelverton, David, MD 10/03/17 [phone]

## 2017-10-06 ENCOUNTER — Telehealth: Payer: Self-pay

## 2017-10-06 NOTE — Telephone Encounter (Signed)
The patient has not contacted the [facility] to schedule an appointment. Letter sent to him instructing him to contact the clinic.

## 2017-10-29 ENCOUNTER — Other Ambulatory Visit: Payer: Self-pay

## 2017-10-29 ENCOUNTER — Emergency Department (HOSPITAL_COMMUNITY)
Admission: EM | Admit: 2017-10-29 | Discharge: 2017-10-29 | Disposition: A | Discharge status: Home / Self Care | Payer: Self-pay | Attending: Emergency Medicine | Admitting: Emergency Medicine

## 2017-10-29 ENCOUNTER — Encounter (HOSPITAL_COMMUNITY): Payer: Self-pay | Admitting: Emergency Medicine

## 2017-10-29 DIAGNOSIS — Z23 Encounter for immunization: Secondary | ICD-10-CM | POA: Insufficient documentation

## 2017-10-29 DIAGNOSIS — R569 Unspecified convulsions: Secondary | ICD-10-CM

## 2017-10-29 DIAGNOSIS — Z79899 Other long term (current) drug therapy: Secondary | ICD-10-CM | POA: Insufficient documentation

## 2017-10-29 DIAGNOSIS — G40909 Epilepsy, unspecified, not intractable, without status epilepticus: Secondary | ICD-10-CM | POA: Insufficient documentation

## 2017-10-29 LAB — I-STAT CHEM 8, ED
BUN: 8 mg/dL (ref 6–20)
CALCIUM ION: 1.18 mmol/L (ref 1.15–1.40)
Chloride: 103 mmol/L (ref 101–111)
Creatinine, Ser: 1 mg/dL (ref 0.61–1.24)
GLUCOSE: 120 mg/dL — AB (ref 65–99)
HCT: 43 % (ref 39.0–52.0)
Hemoglobin: 14.6 g/dL (ref 13.0–17.0)
Potassium: 3.3 mmol/L — ABNORMAL LOW (ref 3.5–5.1)
SODIUM: 142 mmol/L (ref 135–145)
TCO2: 28 mmol/L (ref 22–32)

## 2017-10-29 LAB — CBG MONITORING, ED: Glucose-Capillary: 119 mg/dL — ABNORMAL HIGH (ref 65–99)

## 2017-10-29 MED ORDER — LEVETIRACETAM 500 MG PO TABS: 500.0000 mg | ORAL_TABLET | Freq: Two times a day (BID) | ORAL | 2 refills | Status: AC

## 2017-10-29 MED ORDER — TETANUS-DIPHTH-ACELL PERTUSSIS 5-2.5-18.5 LF-MCG/0.5 IM SUSP
0.5000 mL | Freq: Once | INTRAMUSCULAR | Status: AC
  Administered 2017-10-29: 0.5 mL via INTRAMUSCULAR
  Filled 2017-10-29: qty 0.5

## 2017-10-29 MED ORDER — LEVETIRACETAM IN NACL 1000 MG/100ML IV SOLN
1000.0000 mg | Freq: Once | INTRAVENOUS | Status: AC
  Administered 2017-10-29: 1000 mg via INTRAVENOUS
  Filled 2017-10-29: qty 100

## 2017-10-29 NOTE — Discharge Instructions (Signed)
I recommend close follow-up with a neurologist in [place].

## 2017-10-29 NOTE — ED Provider Notes (Signed)
TIME SEEN: 3:24 AM  CHIEF COMPLAINT: Seizure  HPI: Patient is a 32 year old male with history of epilepsy on Keppra 500 mg twice daily who presents to the emergency department after he had a witnessed seizure just prior to arrival.  Patient states that all night he did not feel well and felt "jerky".  He was watching a movie with his friends.  He states he is 95% sure that he has been compliant with his Keppra.  States that he recently lost an aunt that has been under increased stress and is planning to move to [place] in 2 days.  He thinks this is what has triggered his recent seizures.  He states he has had 4 seizures in the past 5 weeks.  No fevers, cough, vomiting, diarrhea.  Has an abrasion to the left side of his nose.  Unsure of his last tetanus vaccination.  He did bite his upper lip.  Currently has no complaints.  ROS: See HPI Constitutional: no fever  Eyes: no drainage  ENT: no runny nose   Cardiovascular:  no chest pain  Resp: no SOB  GI: no vomiting GU: no dysuria Integumentary: no rash  Allergy: no hives  Musculoskeletal: no leg swelling  Neurological: no slurred speech ROS otherwise negative  PAST MEDICAL HISTORY/PAST SURGICAL HISTORY:  Past Medical History:  Diagnosis Date  . JME (juvenile myoclonic epilepsy) (HCC)   . Seizure [facility](HCC)     MEDICATIONS:  Prior to Admission medications   Medication Sig Start Date End Date Taking? Authorizing Provider  levETIRAcetam (KEPPRA) 500 MG tablet Take 1 tablet (500 mg total) by mouth 2 (two) times daily. 08/21/17  Yes Hairston, Oren BeckmannMandesia R, FNP  meloxicam (MOBIC) 7.5 MG tablet Take 1 tablet (7.5 mg total) by mouth daily as needed for pain. 08/21/17  Yes Lizbeth BarkHairston, Mandesia R, FNP    ALLERGIES:  Allergies  Allergen Reactions  . Clarithromycin Nausea And Vomiting  . Dye Fdc Red [Red Dye] Itching  . Oxycodone Itching and Rash    SOCIAL HISTORY:  Social History   Tobacco Use  . Smoking status: Never Smoker  . Smokeless  tobacco: Never Used  Substance Use Topics  . Alcohol use: No    Alcohol/week: 1.8 oz    Types: 3 Cans of beer per week    Comment: occ    FAMILY HISTORY: History reviewed. No pertinent family history.  EXAM: BP 134/84 (BP Location: Left Arm)   Pulse 94   Temp 98.1 F (36.7 C) (Oral)   Resp 15   Ht 6' 1.5" (1.867 m)   Wt 98.9 kg (218 lb)   SpO2 96%   BMI 28.37 kg/m  CONSTITUTIONAL: Alert and oriented x 3 and responds appropriately to questions. Well-appearing; well-nourished; GCS 15 HEAD: Normocephalic; small abrasion to the left nose, abrasion to the inner upper lip with noted swelling but no laceration EYES: Conjunctivae clear, PERRL, EOMI ENT: normal nose; no rhinorrhea; moist mucous membranes; pharynx without lesions noted; no dental injury; no septal hematoma no bony tenderness of the face NECK: Supple, no meningismus, no LAD; no midline spinal tenderness, step-off or deformity; trachea midline CARD: RRR; S1 and S2 appreciated; no murmurs, no clicks, no rubs, no gallops RESP: Normal chest excursion without splinting or tachypnea; breath sounds clear and equal bilaterally; no wheezes, no rhonchi, no rales; no hypoxia or respiratory distress CHEST:  chest wall stable, no crepitus or ecchymosis or deformity, nontender to palpation; no flail chest ABD/GI: Normal bowel sounds; non-distended; soft, non-tender,  no rebound, no guarding; no ecchymosis or other lesions noted PELVIS:  stable, nontender to palpation BACK:  The back appears normal and is non-tender to palpation, there is no CVA tenderness; no midline spinal tenderness, step-off or deformity EXT: Normal ROM in all joints; non-tender to palpation; no edema; normal capillary refill; no cyanosis, no bony tenderness or bony deformity of patient's extremities, no joint effusion, compartments are soft, extremities are warm and well-perfused, no ecchymosis SKIN: Normal color for age and race; warm NEURO: Moves all extremities  equally with strength 5/5 in all 4 extremities, cranial nerves II through XII intact, normal speech, normal sensation diffusely PSYCH: The patient's mood and manner are appropriate. Grooming and personal hygiene are appropriate.  MEDICAL DECISION MAKING: Patient here after seizure.  Reports compliance with his medications but states he thinks this is secondary to increased stress from recent loss of his aunt and increased stress from moving.  He is neurologically intact currently.  He has an abrasion to the nose and abrasion to the upper lip.  No other sign of trauma on exam.  Will update his tetanus vaccination.  I do not feel he needs emergent imaging.  He does not appear postictal at this time.  Will load him with IV Keppra and check labs.  ED PROGRESS: Labs reassuring including normal glucose and normal sodium.  Still neurologically intact with no seizure activity.  I feel he is safe to be discharged.  He plans to establish care with a neurologist in [place].  We discussed at length return precautions.  I have provided him with refills of his Keppra.  At this time, I do not feel there is any life-threatening condition present. I have reviewed and discussed all results (EKG, imaging, lab, urine as appropriate) and exam findings with patient/family. I have reviewed nursing notes and appropriate previous records.  I feel the patient is safe to be discharged home without further emergent workup and can continue workup as an outpatient as needed. Discussed usual and customary return precautions. Patient/family verbalize understanding and are comfortable with this plan.  Outpatient follow-up has been provided if needed. All questions have been answered.      Ward, Layla Maw, DO 10/29/17 [phone]

## 2017-10-29 NOTE — ED Notes (Signed)
Bed: RESB Expected date:  Expected time:  Means of arrival:  Comments: Seizure/post ictal

## 2017-10-29 NOTE — ED Triage Notes (Signed)
Pt BIB EMS from a friend's home. Patient was watching a movie with his friends when he told them he was seeing an aura and felt twitching. Friend went to the bathroom when he heard the patient hit a cabinet. Per friend, patient seized for about 30 minutes. Post-ictal on arrival with EMS. Small wound noted to left nose. Patient also bit his upper lip. Patient takes Keppra and has not missed a dose. Last seizure was 1 month ago. Patient currently A&O x4.

## 2019-09-19 ENCOUNTER — Ambulatory Visit (INDEPENDENT_AMBULATORY_CARE_PROVIDER_SITE_OTHER): Payer: Self-pay | Admitting: NEUROLOGY

## 2019-10-04 ENCOUNTER — Ambulatory Visit (VACCINATION_CLINIC): Payer: Self-pay

## 2019-10-24 ENCOUNTER — Ambulatory Visit (VACCINATION_CLINIC): Payer: Self-pay

## 2020-05-31 ENCOUNTER — Ambulatory Visit (INDEPENDENT_AMBULATORY_CARE_PROVIDER_SITE_OTHER): Payer: BC Managed Care – PPO

## 2020-05-31 ENCOUNTER — Other Ambulatory Visit: Payer: Self-pay

## 2020-05-31 ENCOUNTER — Encounter (INDEPENDENT_AMBULATORY_CARE_PROVIDER_SITE_OTHER): Payer: Self-pay

## 2020-05-31 ENCOUNTER — Other Ambulatory Visit (INDEPENDENT_AMBULATORY_CARE_PROVIDER_SITE_OTHER): Payer: BC Managed Care – PPO

## 2020-05-31 VITALS — BP 129/86 | HR 85 | Temp 97.9°F | Resp 20 | Ht 72.0 in | Wt 227.0 lb

## 2020-05-31 DIAGNOSIS — W298XXA Contact with other powered powered hand tools and household machinery, initial encounter: Secondary | ICD-10-CM

## 2020-05-31 DIAGNOSIS — T148XXA Other injury of unspecified body region, initial encounter: Secondary | ICD-10-CM

## 2020-05-31 DIAGNOSIS — S61237A Puncture wound without foreign body of left little finger without damage to nail, initial encounter: Secondary | ICD-10-CM

## 2020-05-31 NOTE — Nursing Note (Signed)
Patient has given verbal permission for the scribe to assist the healthcare provider during the patients visit.    [employer], RN 05/31/2020, 18:29

## 2020-05-31 NOTE — Patient Instructions (Addendum)
Please return immediately for worsening symptoms or new and worrisome symptoms.   Puncture Wound    A puncture wound occurs when a pointed object pushes into the skin. It may go into the tissues below the skin, including fat and muscle. This type of wound is narrow and deep and can be hard to clean. Because of this, puncture wounds are at high risk for becoming infected.  X-rays may be done to check the wound for objects stuck under the skin. Your may also need a tetanus shot. This is given if you are not up-to-date on this vaccination and the object that caused the wound may lead to tetanus.  Home care   Your healthcare provider may prescribe an antibiotic. This is to help prevent infection. Follow all instructions for taking this medicine. Take the medicine every day until it is gone or you are told to stop. You should not have any left over.   The healthcare provider may prescribe medicines for pain. Follow instructions for taking them.   You can take acetaminophen or ibuprofen for pain, unless you were given a different pain medicine to use.   Follow the healthcare provider's instructions on how to care for the wound.   Keep the wound clean and dry. Don't get the wound wet until you are told it is OK to do so. If the area gets wet, gently pat it dry with a clean cloth. Replace the wet bandage with a dry one.   If a bandage was applied and it becomes wet or dirty, replace it. Otherwise, leave it in place for the first 24 hours.   Once you can get the wound wet, you may shower as usual but don't soak the wound in water (no tub baths or swimming)   Even with proper treatment, a puncture wound may become infected. Check the wound daily for signs of infection listed below.  Follow-up care  Follow up with your healthcare provider, or as advised.  When to seek medical advice  Call your healthcare provider right away if any of these occur:   Signs of infection, including:  ? Increasing redness or swelling  around the wound  ? Increased warmth of the wound  ? Worsening pain  ? Red streaking lines away from the wound  ? Draining pus   Fever of 100.82F (38.C) or higher, or as directed by your healthcare provider   Wound changes colors   Numbness around the wound   Decreased movement around the injured area  [employer] last reviewed this educational content on 09/08/2016   2000-2021 [employer], [place]. All rights reserved. This information is not intended as a substitute for professional medical care. Always follow your healthcare professional's instructions.

## 2020-05-31 NOTE — Progress Notes (Signed)
Attending: Dr. Electa Sniff  History of Present Illness: Jorge Maddox is a 34 y.o. male who presents to the Urgent Care today with chief complaint of    Chief Complaint              Finger Injury Drill bit into L pinky finger          Pt presents to [facility] with c/o L 5th finger injury. Pt states he was drilling earlier today when the drill bit slipped and went into his L 5th digit. Pt states he has normal ROM and denies numbness or tingling to the digit. He states his last tetanus vaccination was 3 years ago. Pt denies fever.    I reviewed and confirmed the patient's past medical history taken by the nurse or medical assistant with the addition of the following:    Past Medical History:    Past Medical History:   Diagnosis Date    Migraine Headaches 08/19/1997    Seizures      Past Surgical History:    History reviewed. No pertinent surgical history.    Allergies:  Allergies   Allergen Reactions    Biaxin [Clarithromycin] Nausea/ Vomiting    Oxycodone Mental Status Effect     Medications:    Current Outpatient Medications   Medication Sig    levETIRAcetam (KEPPRA) 1,000 mg Oral Tablet Take 1,250 mg by mouth Twice daily     Social History:    Social History     Tobacco Use    Smoking status: Never Smoker    Smokeless tobacco: Never Used   Substance Use Topics    Alcohol use: Yes     Alcohol/week: 1.7 standard drinks     Types: 2 Cans of beer per week     Comment: occ    Drug use: No     Family History:  Family Medical History:    None       Review of Systems:    General: Negative for fever  Musculoskeletal: Positive for Left 5th finger injury   Neurologic: Negative for numbness and paresthesias   All other ROS Negative    Physical Exam:  Vital signs:   Vitals:    05/31/20 1825   BP: 129/86   Pulse: 85   Resp: 20   Temp: 36.6 C (97.9 F)   TempSrc: Thermal Scan   SpO2: 96%   Weight: 103 kg (227 lb)   Height: 1.829 m (6')   BMI: 30.85     Body mass index is 30.79 kg/m. Facility age limit for  growth percentiles is 20 years.  No LMP for male patient.    General:  Well appearing and no acute distress  Head:  Normocephalic  Eyes:  Normal lids/lashes and normal conjunctiva  Neck:  Supple  Pulmonary:  Clear to auscultation bilaterally, no wheezes, no rales and no rhonchi  Cardiovascular:  Regular rate/rhythm, normal S1/S2 and no murmur/rub/gallop  Musculoskeletal: 2 puncture marks to the middle phalange of L 5th finger. Normal ROM. Neurovascularly intact. No bony TTP.   Skin:  Warm/dry and no rash  Psychiatric:  Appropriate affect and behavior  Neurologic:   Alert and oriented x 3    Data Reviewed:    Radiography: 05/31/20 L 5th finger XR  as reviewed by Arvella Merles, PA-C at 18:48: No acute fracture or foreign body    Course: Condition at discharge: Good    Differential Diagnosis: Fracture vs Laceration vs Foreign body  vs Puncture wound     Assessment:   1. Puncture wound        Plan:    Orders Placed This Encounter    Left 5th/Pinkie Finger x-ray     L 5th finger XR: No acute fracture or foreign body  Band-aid applied in clinic.    Go to Emergency Department immediately for further work up if any concerning symptoms.  Plan was discussed and patient verbalized understanding.  If symptoms are worsening or not improving the patient should return to the Urgent Care for further evaluation.    I am scribing for, and in the presence of, Arvella Merles, [place], for services provided on 05/31/2020.  Kerby Moors, SCRIBE     [place], SCRIBE 05/31/2020, 18:51    The co-signing faculty was physically present in [facility] and available for consultation. He/she did not particpate in the care of this patient.  I personally performed the services described in this documentation, as scribed  in my presence, and it is both accurate  and complete.    [address] [place], PA-C  05/31/2020, 19:12

## 2020-11-27 ENCOUNTER — Other Ambulatory Visit (HOSPITAL_COMMUNITY): Payer: Self-pay | Admitting: Neurology

## 2021-02-17 ENCOUNTER — Other Ambulatory Visit (INDEPENDENT_AMBULATORY_CARE_PROVIDER_SITE_OTHER): Payer: Self-pay

## 2021-02-17 ENCOUNTER — Ambulatory Visit (INDEPENDENT_AMBULATORY_CARE_PROVIDER_SITE_OTHER): Payer: Self-pay

## 2021-02-17 ENCOUNTER — Encounter (INDEPENDENT_AMBULATORY_CARE_PROVIDER_SITE_OTHER): Payer: Self-pay

## 2021-02-17 ENCOUNTER — Other Ambulatory Visit: Payer: Self-pay

## 2021-02-17 VITALS — BP 125/81 | HR 74 | Temp 97.9°F | Wt 232.0 lb

## 2021-02-17 DIAGNOSIS — S4990XA Unspecified injury of shoulder and upper arm, unspecified arm, initial encounter: Secondary | ICD-10-CM

## 2021-02-17 DIAGNOSIS — S46912A Strain of unspecified muscle, fascia and tendon at shoulder and upper arm level, left arm, initial encounter: Secondary | ICD-10-CM

## 2021-02-17 DIAGNOSIS — S4992XA Unspecified injury of left shoulder and upper arm, initial encounter: Secondary | ICD-10-CM

## 2021-02-17 DIAGNOSIS — S46919A Strain of unspecified muscle, fascia and tendon at shoulder and upper arm level, unspecified arm, initial encounter: Secondary | ICD-10-CM

## 2021-02-17 DIAGNOSIS — X500XXA Overexertion from strenuous movement or load, initial encounter: Secondary | ICD-10-CM

## 2021-02-17 NOTE — Progress Notes (Signed)
History of Present Illness: Jorge Maddox is a 35 y.o. male who presents to the Urgent Care today with chief complaint of    Chief Complaint              Shoulder Injury Left, happened today          Pt presents to [facility] with c/o L shoulder pain onset 1 hour PTA. Pt reports that he was lifting a box (approximately 60 lbs) with his L arm about 1 hour PTA when he felt something give in his L shoulder. Pt is concerned that he may have dislocated his L shoulder. Pt admits prior hx of R rotator cuff injury and R labrum injury. Pt associates decreased ROM of L shoulder, upper back pain, and numbness and tingling in L arm/hand. Pt denies edema or ecchymosis of L shoulder.    I reviewed and confirmed the patient's past medical history taken by the nurse or medical assistant with the addition of the following:      Past Medical History:    Past Medical History:   Diagnosis Date    Migraine Headaches 08/19/1997    Seizures      Past Surgical History:    History reviewed. No pertinent surgical history.    Allergies:  Allergies   Allergen Reactions    Biaxin [Clarithromycin] Nausea/ Vomiting    Iv Contrast     Oxycodone Mental Status Effect     Medications:    Current Outpatient Medications   Medication Sig    levETIRAcetam (KEPPRA) 1,000 mg Oral Tablet Take 1,250 mg by mouth Twice daily     Social History:    Social History     Tobacco Use    Smoking status: Never Smoker    Smokeless tobacco: Never Used   Substance Use Topics    Alcohol use: Yes     Alcohol/week: 1.7 standard drinks     Types: 2 Cans of beer per week     Comment: occ    Drug use: No     Family History:  Family Medical History:    None       Review of Systems:    Musculoskeletal: L shoulder pain, decreased ROM of L shoulder, upper back pain  Neurologic: numbness and tingling in L arm/hand  Skin: no edema or ecchymosis of L shoulder  All other review of systems were negative      Physical Exam:  Vital signs:   Vitals:    02/17/21 1937    BP: 125/81   Pulse: 74   Temp: 36.6 C (97.9 F)   TempSrc: Thermal Scan   SpO2: 97%   Weight: 105 kg (232 lb)     Body mass index is 31.46 kg/m. Facility age limit for growth percentiles is 20 years.  No LMP for male patient.    General:  Healthy appearing and no acute distress  Head:  Normocephalic and atraumatic   Eyes:  Normal lids/lashes, EOMI and normal conjunctiva  Pulmonary:  Equal breath sounds, no wheezes, no rales and no rhonchi   Cardiovascular:  Regular rate/rhythm without murmur, normal radial pulses without peripheral edema    Musculoskeletal (LUE): Patient guarding against movement of his L arm and splinting arm against abdomen. TTP over anterior shoulder over insertion of biceps tendon. Body of L trapezius, including its insertions on shoulder and thoracic spine, are extremely TTP. Adduction of L shoulder limited to 90 degrees. FROM of L elbow. Distal  neurovascular intact in L hand. No visible or palpable deformity of shoulder or AC joint.   Skin:  No visible rash on exposed skin  Psychiatric:  Appropriate affect and behavior  Neurologic:   Alert and oriented x 3      Data Reviewed:    Radiography:  L Shoulder XR  Reviewed by Dr. Joline Maxcy on 02/17/2021 at 1954. No bony abnormality. No dislocation. No AC separation.     Course: Condition at discharge: Good     Differential Diagnosis: Trapezius strain vs. AC strain vs. Rotator cuff injury vs. Bicep tendinitis vs. Dislocation    Assessment:   1. Shoulder injury    2. Shoulder strain        Plan:    Orders Placed This Encounter    Left shoulder x-ray     Performed L shoulder x-ray in clinic. No bony abnormality. No dislocation. No AC separation.  Educated on symptomatic treatment with OTC medications for pain relief.  Recommended supportive care with rest and RICE therapy.  Advised to follow up as needed for worsening or persisting symptoms.   See printed DC instructions.    Go to Emergency Department immediately for further work up if any  concerning symptoms.  Plan was discussed and patient verbalized understanding. If symptoms are worsening or not improving the patient should return to the [facility] for further evaluation.    I am scribing for, and in the presence of, Dr. Joline Maxcy for services provided on 02/17/2021.  [address] SCRIBE   [place], [place] 02/17/2021, 20:00    I personally performed the services described in this documentation, as scribed  in my presence, and it is both accurate  and complete.    Darliss Ridgel, MD

## 2021-03-02 ENCOUNTER — Emergency Department (HOSPITAL_COMMUNITY): Payer: Worker's Comp, Other unspecified

## 2021-03-02 ENCOUNTER — Other Ambulatory Visit: Payer: Self-pay

## 2021-03-02 ENCOUNTER — Emergency Department
Admission: EM | Admit: 2021-03-02 | Discharge: 2021-03-02 | Disposition: A | Discharge status: Home / Self Care | Payer: Worker's Compensation | Attending: Acute Care | Admitting: Acute Care

## 2021-03-02 ENCOUNTER — Emergency Department (HOSPITAL_COMMUNITY): Payer: Worker's Compensation | Admitting: Radiology

## 2021-03-02 ENCOUNTER — Encounter (HOSPITAL_COMMUNITY): Payer: Self-pay | Admitting: Acute Care

## 2021-03-02 DIAGNOSIS — S6982XA Other specified injuries of left wrist, hand and finger(s), initial encounter: Secondary | ICD-10-CM | POA: Insufficient documentation

## 2021-03-02 DIAGNOSIS — L89896 Pressure-induced deep tissue damage of other site: Secondary | ICD-10-CM

## 2021-03-02 DIAGNOSIS — Z8669 Personal history of other diseases of the nervous system and sense organs: Secondary | ICD-10-CM | POA: Insufficient documentation

## 2021-03-02 DIAGNOSIS — Z23 Encounter for immunization: Secondary | ICD-10-CM | POA: Insufficient documentation

## 2021-03-02 DIAGNOSIS — X58XXXA Exposure to other specified factors, initial encounter: Secondary | ICD-10-CM | POA: Insufficient documentation

## 2021-03-02 MED ORDER — HYDROCODONE 5 MG-ACETAMINOPHEN 325 MG TABLET
1.0000 | ORAL_TABLET | Freq: Four times a day (QID) | ORAL | 0 refills | Status: DC | PRN
Start: 2021-03-02 — End: 2021-06-20

## 2021-03-02 MED ORDER — MORPHINE 4 MG/ML INTRAVENOUS SOLUTION
4.0000 mg | INTRAVENOUS | Status: AC
Start: 2021-03-02 — End: 2021-03-02
  Administered 2021-03-02 (×2): 4 mg via INTRAVENOUS
  Filled 2021-03-02: qty 1

## 2021-03-02 MED ORDER — DIPHTH,PERTUSSIS(ACEL),TETANUS 2.5 LF UNIT-8 MCG-5 LF/0.5ML IM SYRINGE
0.5000 mL | INJECTION | INTRAMUSCULAR | Status: AC
Start: 2021-03-02 — End: 2021-03-02
  Administered 2021-03-02 (×2): 0.5 mL via INTRAMUSCULAR
  Filled 2021-03-02: qty 0.5

## 2021-03-02 MED ORDER — DEXTROSE 5 % IN WATER (D5W) INTRAVENOUS SOLUTION
2.0000 g | INTRAVENOUS | Status: AC
Start: 2021-03-02 — End: 2021-03-02
  Administered 2021-03-02 (×2): 2 g via INTRAVENOUS
  Filled 2021-03-02: qty 20

## 2021-03-02 MED ORDER — DOXYCYCLINE HYCLATE 100 MG CAPSULE
100.0000 mg | ORAL_CAPSULE | ORAL | Status: AC
Start: 2021-03-02 — End: 2021-03-02
  Administered 2021-03-02: 100 mg via ORAL
  Filled 2021-03-02: qty 1

## 2021-03-02 MED ORDER — HYDROCODONE 5 MG-ACETAMINOPHEN 325 MG TABLET
1.0000 | ORAL_TABLET | ORAL | Status: AC
Start: 2021-03-02 — End: 2021-03-02
  Administered 2021-03-02: 1 via ORAL
  Filled 2021-03-02: qty 1

## 2021-03-02 MED ORDER — DOXYCYCLINE HYCLATE 100 MG TABLET
100.0000 mg | ORAL_TABLET | Freq: Two times a day (BID) | ORAL | 0 refills | Status: DC
Start: 2021-03-02 — End: 2021-03-09

## 2021-03-02 NOTE — ED Provider Notes (Signed)
[facility] - Emergency Department  ED Primary Provider Note  History of Present Illness   Chief Complaint   Patient presents with   . Hand Injury     Powerwashing injury to L palm @1705 . No bleeding noted to area.     Arrival: The patient arrived by Car and is alone  History provided by: patient  History Limitations: none    Jorge Maddox is a 35 y.o. male who had concerns including Hand Injury.  Pt reports cleaning up work equipment at 33 with a pressure washer.  He states he struck his left hand about 1705 this evening at the base of the 3rd and 4th phalanges. He notes worsening pain and swelling to the L hand since onset.   Unknown last tetanus.  Patient ambidextrous, but however uses right more than left.  Nondiabetic.  Patient works in [employer].  He denies fever, chills, nausea, vomiting, and any other complaints or concerns at this time.   PMHx seizures.  Review of Systems   Review of Systems   Constitutional: Negative for chills, fatigue and fever.   Respiratory: Negative for cough and shortness of breath.    Cardiovascular: Negative for chest pain.   Gastrointestinal: Negative for abdominal pain, nausea and vomiting.   Musculoskeletal: Positive for arthralgias (Left hand).   Skin: Negative for rash.   Psychiatric/Behavioral: Negative for suicidal ideas.   All other systems reviewed and are negative.    Historical Data   History Reviewed This Encounter: Medical History  Surgical History  Family History  Social History      Past Medical History:   Diagnosis Date   . Migraine Headaches 08/19/1997   . Seizures      History reviewed. No pertinent surgical history.  Family Medical History:    None       Social History     Tobacco Use   . Smoking status: Never Smoker   . Smokeless tobacco: Never Used   Substance Use Topics   . Alcohol use: Yes     Alcohol/week: 1.7 standard drinks     Types: 2 Cans of beer per week     Comment: occ   . Drug use: No     Allergies   Allergen Reactions   .  Biaxin [Clarithromycin] Nausea/ Vomiting   . Iv Contrast    . Oxycodone Mental Status Effect     Medications Prior to Admission     Prescriptions    levETIRAcetam (KEPPRA) 1,000 mg Oral Tablet    Take 1,250 mg by mouth Twice daily        Physical Exam   ED Triage Vitals   BP (Non-Invasive) 03/02/21 2017 (!) 161/111   Heart Rate 03/02/21 2017 82   Respiratory Rate 03/02/21 2017 15   Temperature 03/02/21 2017 36.3 C (97.3 F)   SpO2 03/02/21 2017 97 %   Weight 03/02/21 2018 106 kg (232 lb 12.9 oz)   Height 03/02/21 2017 1.854 m (6\' 1" )     Physical Exam  Vitals and nursing note reviewed.   Constitutional:       Appearance: Normal appearance. He is normal weight.   HENT:      Head: Normocephalic.      Mouth/Throat:      Mouth: Mucous membranes are moist.   Eyes:      Pupils: Pupils are equal, round, and reactive to light.   Cardiovascular:      Rate and Rhythm: Normal  rate and regular rhythm.      Pulses: Normal pulses.      Heart sounds: Normal heart sounds.   Pulmonary:      Effort: Pulmonary effort is normal.      Breath sounds: Normal breath sounds.   Abdominal:      General: Abdomen is flat. Bowel sounds are normal. There is no distension.   Musculoskeletal:      Right hand: Swelling, laceration (Base of the 3rd and 4th phalanges on palmar surface) and tenderness present. Decreased strength ( secondary to pain). Normal sensation. There is no disruption of two-point discrimination. Normal capillary refill. Normal pulse.   Skin:     General: Skin is warm.      Capillary Refill: Capillary refill takes less than 2 seconds.   Neurological:      General: No focal deficit present.      Mental Status: He is alert and oriented to person, place, and time. Mental status is at baseline.      Motor: No weakness.                 Above images obtained with express consent of patient for purposes of placing in ED chart/documentation to facilitate care. They are an accurate representation of my physical exam findings.        Patient Data   Labs Reviewed - No data to display  XR HAND LEFT   Final Result by Edi, Radresults In (08/23 2132)   Soft tissue emphysema of the hand as noted.         Radiologist location ID: [id number]             Medical Decision Making   MDM/Course:  Jorge Maddox is a 35 y.o. male who had concerns including Hand Injury.  Given patient's history, current symptoms, and exam; concern for, but not limited to, laceration versus abrasion versus fracture.  Relevant/pertinent previous medical records reviewed via chart review activity and/or CareEverywhere activity.    ED Course as of 03/02/21 2334   Tue Mar 02, 2021   2047 Pressure washer injury at 1705 this evening to L hand.    2125 Hand surgery paged.    2132 XR HAND LEFT  IMPRESSION:  Soft tissue emphysema of the hand as noted.   2132 Given pressure washer was with water only at this time along with CMS being intact, orthopedics recommend close follow-up as an outpatient and doxycycline.     The hand itself was cleaned, a very loose dressing is applied over the wound itself.  Patient tolerated.  During the course emergency department his digits remain warm, dry, with cap refill less than 3 seconds and sensation is intact.    Referral placed for stat Hand surgery follow-up.  He is instructed to return with new or worsening symptoms to include numbness, tingling, loss of sensation, worsening redness, swelling, pain or fevers.  Patient's wife was present at bedside in addition to the patient.  He is also instructed to elevate the extremity to help with pain as well.  They both verbalized understanding and were agreeable.         Medications Administered in the ED   ceFAZolin (ANCEF) 2 g in D5W 50 mL IVPB (2 g Intravenous Given 03/02/21 2114)   diphtheria, pertussis-acell, tetanus (BOOSTRIX) IM injection (0.5 mL IntraMUSCULAR Given 03/02/21 2111)   morphine 4 mg/mL injection (4 mg Intravenous Given 03/02/21 2109)   doxycycline hyclate (VIBRAMYCIN) capsule (100  mg  Oral Given 03/02/21 2211)   HYDROcodone-acetaminophen (NORCO) 5-325 mg per tablet (1 Tablet Oral Given 03/02/21 2212)     Encounter Diagnosis   Name Primary?   . High pressure injury of left hand Yes       Following the history, physical exam, and ED workup, the patient was deemed stable and suitable for discharge. The patient/caregiver was advised to return to the ED for any new or worsening symptoms. Discharge medications, and follow-up instructions were discussed with the patient/caregiver in detail, who verbalizes understanding. The patient/caregiver is in agreement and is comfortable with the plan of care.    Disposition: Discharged      Discharge Medication List as of 03/02/2021  9:59 PM      START taking these medications    Details   doxycycline 100 mg Oral Tablet Take 1 Tablet (100 mg total) by mouth Twice daily for 7 days, Disp-14 Tablet, R-0, E-Rx      HYDROcodone-acetaminophen (NORCO) 5-325 mg Oral Tablet Take 1 Tablet by mouth Every 6 hours as needed for Pain, Disp-12 Tablet, R-0, E-Rx           A comprehensive pain management plan for the patient was developed and discussed with the patient. The patient's previous experience with non-opioid and opioid medications was reviewed. The patient's experience with non-pharmacological/alternative therapies for pain management was reviewed. The patient's history in regard to substance abuse was reviewed. Information from the Controlled Substance Monitoring Program was accessed. Alternative treatments, non-pharmacological pain management options, were prescribed as a part of the comprehensive the pain management treatment plan. The risks, benefits, and availability of these alternative treatment options were reviewed. Non-opioid, pharmacological pain management options (Tylenol, ibuprofen, ice, elevation), were prescribed, and the risks and benefits of these options were reviewed. Opioid pain management options were discussed, including the risks and benefits of  these options. Specifically, the patient was advised that opioids are highly addictive, even when taken as prescribed, that there is a risk of developing a physical or psychological dependence on the controlled substance, and that the risks of taking more opioids than prescribed, or mixing sedatives, benzodiazepines, or alcohol with opioids, can result in fatal respiratory depression. It was determined that an opioid prescription medication is a necessary and appropriate part of the patient's comprehensive pain management plan.    The patient was advised they would be receiving a prescription for 12 doses of Lortab 5 at discharge. The patient was advised they could fill the prescription for a lesser amount if they chose. They were advised if they did choose to partially fill the prescription they might not be able to get an addition opioid prescription for several days if they ran out of medication. The patient and/or their guardian were given an opportunity to ask questions about the comprehensive pain management plan and their questions were answered.    Follow up:   Dennis Bast, MD  [address]  [place] [address]  [phone]      As needed, If symptoms worsen    [facility] - Emergency Department  [address]  [place] [address]  [phone]    As needed, If symptoms worsen           I am scribing for, and in the presence of Gwynne Edinger, APRN, AGACNP-BC for services provided on 03/02/2021.  Mekalea Whitehair, SCRIBE   Mekalea Reliance, SCRIBE  03/02/2021, 20:45    I personally performed the services described in this documentation,  as scribed  in my presence, and it is both accurate  and complete.    Donell Sievert, APRN,AGACNP-BC  Donell Sievert, APRN,AGACNP-BC  03/02/2021, 23:33    The supervising physician was physically present and available for consultation, and did not physically see the patient.

## 2021-03-02 NOTE — Discharge Instructions (Addendum)
Please monitor symptoms.  If you develop worsening pain, numbness, tingling, loss of sensation, or your fingers become cold pale or numb- please return immediately to the emergency department.  I have placed a referral to Hand surgery.  Please take your antibiotics as prescribed.  As well as the pain medication.  Please be advised that the pain medication does have Tylenol so please monitor your Tylenol intake. You can have up to 4 grams of Tylenol per day- that is eight extra strength Tylenol.  As well elevate your left upper extremity often.

## 2021-03-02 NOTE — ED Nurses Note (Signed)
Pt remains alert and oriented and in no acute distress.  IV removed with catheter intact, dressing in place, no bleeding noted.  Discharge instructions explained to patient and all questions answered.  Pt given printed instructions and prescriptions.  Pt ambulated from department without issue.

## 2021-03-03 ENCOUNTER — Ambulatory Visit: Payer: Worker's Compensation | Admitting: Physician Assistant

## 2021-03-03 ENCOUNTER — Encounter (HOSPITAL_BASED_OUTPATIENT_CLINIC_OR_DEPARTMENT_OTHER): Payer: Self-pay | Admitting: Physician Assistant

## 2021-03-03 ENCOUNTER — Other Ambulatory Visit: Payer: Self-pay

## 2021-03-03 VITALS — Ht 73.0 in | Wt 232.0 lb

## 2021-03-03 DIAGNOSIS — T709XXA Effect of air pressure and water pressure, unspecified, initial encounter: Secondary | ICD-10-CM | POA: Insufficient documentation

## 2021-03-03 DIAGNOSIS — S61213A Laceration without foreign body of left middle finger without damage to nail, initial encounter: Secondary | ICD-10-CM

## 2021-03-03 DIAGNOSIS — M7989 Other specified soft tissue disorders: Secondary | ICD-10-CM | POA: Insufficient documentation

## 2021-03-03 DIAGNOSIS — S6982XA Other specified injuries of left wrist, hand and finger(s), initial encounter: Secondary | ICD-10-CM | POA: Insufficient documentation

## 2021-03-03 DIAGNOSIS — W298XXA Contact with other powered powered hand tools and household machinery, initial encounter: Secondary | ICD-10-CM

## 2021-03-03 NOTE — H&P (Signed)
Orthopedics, [facility]  [address]  [place] [address]  [phone]      Date: 03/03/2021  Name: Jorge Maddox  Age: 35 y.o.  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 He reports that he was using a pressure washer on 03/03/2021 when the water contacted his hand in a very close proximity. He sustained a laceration to the palmar aspect of the hand around the middle finger MP joint. He was seen in the emergency department following the incident. The wound was cleaned and dressed while the patient was in the emergency department. He was referred to the clinic for further evaluation. He rates his pain at a 6 out of 10. It is a constant sharp, burning pain. Pain is increased with use and movement. Pain is decreased with rest. He takes Hydrocodone  as needed for pain management. He denies any numbness or tingling in the left hand. He is not a diabetic and denies any tobacco use. He is here today for further evaluation.    Past Medical History:     Past Medical History:   Diagnosis Date    Migraine Headaches 08/19/1997    Seizures              Family Medical History:    None         Social History     Tobacco Use    Smoking status: Never Smoker    Smokeless tobacco: Never Used   Substance Use Topics    Alcohol use: Yes     Alcohol/week: 1.7 standard drinks     Types: 2 Cans of beer per week     Comment: occ     Current Outpatient Medications   Medication Sig    doxycycline 100 mg Oral Tablet Take 1 Tablet (100 mg total) by mouth Twice daily for 7 days    HYDROcodone-acetaminophen (NORCO) 5-325 mg Oral Tablet Take 1 Tablet by mouth Every 6 hours as needed for Pain    levETIRAcetam (KEPPRA)  1,000 mg Oral Tablet Take 1,250 mg by mouth Twice daily     Allergies   Allergen Reactions    Biaxin [Clarithromycin] Nausea/ Vomiting    Iv Contrast     Oxycodone Mental Status Effect       Review of Systems:     As per HPI otherwise Review of Systems is Negative.    Physical Examination:     Ht 1.854 m (6\' 1" )   Wt 105 kg (232 lb)   BMI 30.61 kg/m         GENERAL: Patient is in no acute distress. Awake, alert and oriented x 3. Mood is appropriate.  HEENT: Head is normocephalic, atraumatic. Extraocular movements are intact  NECK: Demonstrates functional range of motion. Trachea is midline.  CHEST: Demonstrates full expansion with inspiration.  HEART: Regular rate and rhythm.  MUSCULOSKELETAL: Left upper extremity: Wound noted of the palmar aspect of the hand around the middle finger MP joint. No signs of infection. No erythema or increased warmth.  No drainage or palpable collections. No palpable masses. No lymphadenopathy. He has tenderness  to palpation throughout the hand. His tenderness is most noted at the middle finger. Swelling noted in the palm of the hand and into the digits. Swelling noted throughout the dorsal hand as well. He has mild pain with passive flexion and extension of the ring finger. He has little to no pain with passive flexion and extension of the index and small fingers. He is unable to make a fist. Functional range of motion of the wrist. Radial pulses 2+. Sensation is intact throughout the hand. Capillary refill is less than 2 seconds.              Procedure:   None    Data Reviewed:   I personally reviewed the patient's x-ray. Three views of the left hand reveals soft tissue emphysema surrounding the second, third, and fourth metacarpals. It is primarily affecting the middle finger.     Assessment:       ICD-10-CM    1. Contact with other powered hand tools and household machinery, initial encounter  W29.8XXA    2. Traumatic effect of water pressure  T70.9XXA    3. High pressure injury  of left hand  S69.82XA    4. Swelling of left hand  M79.89        Plan:   1) I discussed my evaluation and treatment plan with Jorge Maddox.    2) We did discuss the course of high pressure injuries. I discussed the patient's status with Dr. Celedonio Savage. We discussed treatment moving forward with warm epsom salt water soaks and proper wound care. He was advised to continue taking the Doxycycline as prescribed from the emergency department. Dr. Celedonio Savage plans to re-evaluate him on Friday morning and see any improvement in his symptoms with non-operative treatment. If the patient's symptoms become worse or do not improve, we discussed the possibility of a left hand I&D. We discussed the risks and benefits of this. He will return for a re-evaluation with Dr. Celedonio Savage on 03/05/2021 at 10:00 am. He was advised not to eat or drink prior to his appointment on Friday. All questions were answered to the patient and his wife's satisfaction. They expressed understanding and wished to proceed with this treatment plan. He was provided with a letter allowing him to return to light duty at work. His restrictions were included in the letter.    3) We will plan to him back on 03/05/2021 for a swelling check and for re-evaluation. He is to call or return sooner if he develops any new or concerning symptoms. All questions were answered to his satisfaction. He does understand and agree with the treatment plan. Patient will follow up as directed.      I am scribing for, and in the presence of, Kallie Edward, PA-C for services provided on 03/03/2021.  Ciro Backer, MA, SCRIBE      [place], [place] 03/03/2021, 18:08             COVID-19 Admission Screen    Low Risk:   Able to Provide asymptomatic History  No recent Travel/ Following Stay at Home  No Sick Contacts  No New Household Conatcts  None    Moderate/High Risk: {None    Test Result:Not Indicated      I personally performed the services described in this documentation, as scribed  in my  presence, and it is both accurate  and complete.    Kallie Edward, PA-C    I have reviewed the note and made changes where  needed.  I agree with the plan as above.    Marnee Spring, MD

## 2021-03-05 ENCOUNTER — Encounter (HOSPITAL_BASED_OUTPATIENT_CLINIC_OR_DEPARTMENT_OTHER): Payer: Self-pay | Admitting: Orthopaedic Surgery

## 2021-03-05 ENCOUNTER — Inpatient Hospital Stay (HOSPITAL_COMMUNITY): Payer: Worker's Compensation | Admitting: Hospitalist

## 2021-03-05 ENCOUNTER — Ambulatory Visit (HOSPITAL_COMMUNITY): Payer: Worker's Compensation | Admitting: Anesthesiology

## 2021-03-05 ENCOUNTER — Ambulatory Visit: Payer: Worker's Compensation | Attending: Orthopaedic Surgery | Admitting: Orthopaedic Surgery

## 2021-03-05 ENCOUNTER — Encounter (HOSPITAL_COMMUNITY): Payer: Self-pay | Admitting: Orthopaedic Surgery

## 2021-03-05 ENCOUNTER — Other Ambulatory Visit: Payer: Self-pay

## 2021-03-05 ENCOUNTER — Ambulatory Visit (HOSPITAL_BASED_OUTPATIENT_CLINIC_OR_DEPARTMENT_OTHER): Payer: Worker's Compensation | Admitting: Anesthesiology

## 2021-03-05 ENCOUNTER — Inpatient Hospital Stay
Admission: EM | Admit: 2021-03-05 | Discharge: 2021-03-09 | DRG: 982 | Disposition: A | Discharge status: Home / Self Care | Payer: Worker's Compensation | Attending: Hospitalist | Admitting: Hospitalist

## 2021-03-05 ENCOUNTER — Encounter (HOSPITAL_COMMUNITY)
Admission: EM | Disposition: A | Discharge status: Home / Self Care | Payer: Self-pay | Source: Home / Self Care | Attending: Hospitalist

## 2021-03-05 ENCOUNTER — Ambulatory Visit (HOSPITAL_COMMUNITY): Admit: 2021-03-05 | Payer: Self-pay | Admitting: Orthopaedic Surgery

## 2021-03-05 DIAGNOSIS — M79645 Pain in left finger(s): Secondary | ICD-10-CM

## 2021-03-05 DIAGNOSIS — M79642 Pain in left hand: Secondary | ICD-10-CM

## 2021-03-05 DIAGNOSIS — L03114 Cellulitis of left upper limb: Secondary | ICD-10-CM

## 2021-03-05 DIAGNOSIS — L02519 Cutaneous abscess of unspecified hand: Secondary | ICD-10-CM | POA: Diagnosis present

## 2021-03-05 DIAGNOSIS — M7989 Other specified soft tissue disorders: Secondary | ICD-10-CM

## 2021-03-05 DIAGNOSIS — M65841 Other synovitis and tenosynovitis, right hand: Secondary | ICD-10-CM

## 2021-03-05 DIAGNOSIS — G809 Cerebral palsy, unspecified: Secondary | ICD-10-CM | POA: Diagnosis present

## 2021-03-05 DIAGNOSIS — L0390 Cellulitis, unspecified: Secondary | ICD-10-CM

## 2021-03-05 DIAGNOSIS — T709XXA Effect of air pressure and water pressure, unspecified, initial encounter: Secondary | ICD-10-CM | POA: Insufficient documentation

## 2021-03-05 DIAGNOSIS — Z885 Allergy status to narcotic agent status: Secondary | ICD-10-CM

## 2021-03-05 DIAGNOSIS — G40409 Other generalized epilepsy and epileptic syndromes, not intractable, without status epilepticus: Secondary | ICD-10-CM

## 2021-03-05 DIAGNOSIS — Z79891 Long term (current) use of opiate analgesic: Secondary | ICD-10-CM

## 2021-03-05 DIAGNOSIS — G43909 Migraine, unspecified, not intractable, without status migrainosus: Secondary | ICD-10-CM | POA: Diagnosis present

## 2021-03-05 DIAGNOSIS — L02511 Cutaneous abscess of right hand: Secondary | ICD-10-CM

## 2021-03-05 DIAGNOSIS — S61213A Laceration without foreign body of left middle finger without damage to nail, initial encounter: Secondary | ICD-10-CM

## 2021-03-05 DIAGNOSIS — M79643 Pain in unspecified hand: Secondary | ICD-10-CM

## 2021-03-05 DIAGNOSIS — L02512 Cutaneous abscess of left hand: Principal | ICD-10-CM | POA: Diagnosis present

## 2021-03-05 DIAGNOSIS — M659 Synovitis and tenosynovitis, unspecified: Secondary | ICD-10-CM

## 2021-03-05 DIAGNOSIS — Z79899 Other long term (current) drug therapy: Secondary | ICD-10-CM

## 2021-03-05 DIAGNOSIS — Z888 Allergy status to other drugs, medicaments and biological substances status: Secondary | ICD-10-CM

## 2021-03-05 DIAGNOSIS — B965 Pseudomonas (aeruginosa) (mallei) (pseudomallei) as the cause of diseases classified elsewhere: Secondary | ICD-10-CM | POA: Diagnosis present

## 2021-03-05 DIAGNOSIS — S61412D Laceration without foreign body of left hand, subsequent encounter: Secondary | ICD-10-CM

## 2021-03-05 DIAGNOSIS — G40B09 Juvenile myoclonic epilepsy, not intractable, without status epilepticus: Secondary | ICD-10-CM | POA: Diagnosis present

## 2021-03-05 LAB — BASIC METABOLIC PANEL
ANION GAP: 6 mmol/L
BUN/CREA RATIO: 12
BUN: 11 mg/dL (ref 10–25)
CALCIUM: 9.5 mg/dL (ref 8.8–10.3)
CHLORIDE: 103 mmol/L (ref 98–111)
CO2 TOTAL: 30 mmol/L (ref 21–35)
CREATININE: 0.94 mg/dL (ref <=1.30)
ESTIMATED GFR: >60 mL/min/{1.73_m2}
GLUCOSE: 92 mg/dL (ref 70–110)
POTASSIUM: 4.1 mmol/L (ref 3.5–5.0)
SODIUM: 139 mmol/L (ref 135–145)

## 2021-03-05 LAB — CBC WITH DIFF
BASOPHIL #: <0.1 10*3/uL (ref <=0.20)
BASOPHIL %: 0 %
EOSINOPHIL #: 0.11 10*3/uL (ref <=0.50)
EOSINOPHIL %: 2 %
HCT: 45.5 % (ref 38.9–52.0)
HGB: 15.1 g/dL (ref 13.4–17.5)
IMMATURE GRANULOCYTE #: <0.1 10*3/uL (ref <0.10)
IMMATURE GRANULOCYTE %: 1 % (ref 0–1)
LYMPHOCYTE #: 1.19 10*3/uL (ref 1.00–4.80)
LYMPHOCYTE %: 19 %
MCH: 30.7 pg (ref 26.0–32.0)
MCHC: 33.2 g/dL (ref 31.0–35.5)
MCV: 92.5 fL (ref 78.0–100.0)
MONOCYTE #: 0.66 10*3/uL (ref 0.20–1.10)
MONOCYTE %: 11 %
MPV: 10.9 fL (ref 8.7–12.5)
NEUTROPHIL #: 4.19 10*3/uL (ref 1.50–7.70)
NEUTROPHIL %: 67 %
PLATELETS: 239 10*3/uL (ref 150–400)
RBC: 4.92 10*6/uL (ref 4.50–6.10)
RDW-CV: 12.4 % (ref 11.5–15.5)
WBC: 6.2 10*3/uL (ref 3.7–11.0)

## 2021-03-05 SURGERY — IRRIGATION AND DEBRIDEMENT HAND
Anesthesia: General | Site: Hand | Laterality: Left | Wound class: Dirty or Infected Wounds-Include old traumatic wounds

## 2021-03-05 MED ORDER — PROPOFOL 10 MG/ML IV BOLUS
INJECTION | INTRAVENOUS | Status: AC
Start: 2021-03-05 — End: 2021-03-05
  Filled 2021-03-05: qty 20

## 2021-03-05 MED ORDER — FENTANYL (PF) 50 MCG/ML INJECTION SOLUTION
Freq: Once | INTRAMUSCULAR | Status: DC | PRN
Start: 2021-03-05 — End: 2021-03-05
  Administered 2021-03-05: 100 ug via INTRAVENOUS

## 2021-03-05 MED ORDER — SODIUM CHLORIDE 0.9 % (FLUSH) INJECTION SYRINGE
3.0000 mL | INJECTION | Freq: Three times a day (TID) | INTRAMUSCULAR | Status: DC
Start: 2021-03-05 — End: 2021-03-05

## 2021-03-05 MED ORDER — SUGAMMADEX 100 MG/ML INTRAVENOUS SOLUTION
Freq: Once | INTRAVENOUS | Status: DC | PRN
Start: 2021-03-05 — End: 2021-03-05
  Administered 2021-03-05: 200 mg via INTRAVENOUS

## 2021-03-05 MED ORDER — PROPOFOL 10 MG/ML IV BOLUS
INJECTION | Freq: Once | INTRAVENOUS | Status: DC | PRN
Start: 2021-03-05 — End: 2021-03-05
  Administered 2021-03-05 (×2): 200 mg via INTRAVENOUS

## 2021-03-05 MED ORDER — MIDAZOLAM 1 MG/ML INJECTION WRAPPER
Freq: Once | INTRAMUSCULAR | Status: DC | PRN
Start: 2021-03-05 — End: 2021-03-05
  Administered 2021-03-05: 2 mg via INTRAVENOUS

## 2021-03-05 MED ORDER — ONDANSETRON HCL (PF) 4 MG/2 ML INJECTION SOLUTION
INTRAMUSCULAR | Status: AC
Start: 2021-03-05 — End: 2021-03-05
  Filled 2021-03-05: qty 2

## 2021-03-05 MED ORDER — DEXAMETHASONE SODIUM PHOSPHATE 4 MG/ML INJECTION SOLUTION
Freq: Once | INTRAMUSCULAR | Status: DC | PRN
Start: 2021-03-05 — End: 2021-03-05
  Administered 2021-03-05: 4 mg via INTRAVENOUS

## 2021-03-05 MED ORDER — VANCOMYCIN 10 GRAM INTRAVENOUS SOLUTION
18.0000 mg/kg | INTRAVENOUS | Status: AC
Start: 2021-03-05 — End: 2021-03-05
  Administered 2021-03-05: 0 mg via INTRAVENOUS
  Administered 2021-03-05: 1750 mg via INTRAVENOUS
  Filled 2021-03-05 (×2): qty 17.5

## 2021-03-05 MED ORDER — ALBUTEROL SULFATE 2.5 MG/3 ML (0.083 %) SOLUTION FOR NEBULIZATION
2.5000 mg | INHALATION_SOLUTION | Freq: Four times a day (QID) | RESPIRATORY_TRACT | Status: DC | PRN
Start: 2021-03-05 — End: 2021-03-09

## 2021-03-05 MED ORDER — CEFOXITIN 2G IN NS 50ML MINIBAG IVPB
Freq: Once | INTRAVENOUS | Status: DC | PRN
Start: 2021-03-05 — End: 2021-03-05
  Administered 2021-03-05: 2 g via INTRAVENOUS

## 2021-03-05 MED ORDER — VANCOMYCIN IV - PHARMACIST TO DOSE PER PROTOCOL
Freq: Every day | Status: DC | PRN
Start: 2021-03-05 — End: 2021-03-09

## 2021-03-05 MED ORDER — DEXAMETHASONE SODIUM PHOSPHATE 4 MG/ML INJECTION SOLUTION
INTRAMUSCULAR | Status: AC
Start: 2021-03-05 — End: 2021-03-05
  Filled 2021-03-05: qty 1

## 2021-03-05 MED ORDER — SUCCINYLCHOLINE 20 MG/ML INTRAVENOUS WRAPPER
INJECTION | Freq: Once | INTRAVENOUS | Status: DC | PRN
Start: 2021-03-05 — End: 2021-03-05
  Administered 2021-03-05: 180 mg via INTRAVENOUS

## 2021-03-05 MED ORDER — DIPHENHYDRAMINE 50 MG/ML INJECTION SOLUTION
50.0000 mg | Freq: Four times a day (QID) | INTRAMUSCULAR | Status: DC | PRN
Start: 2021-03-05 — End: 2021-03-09
  Filled 2021-03-05: qty 1

## 2021-03-05 MED ORDER — ROCURONIUM 10 MG/ML INTRAVENOUS SYRINGE WRAPPER
INJECTION | Freq: Once | INTRAVENOUS | Status: DC | PRN
Start: 2021-03-05 — End: 2021-03-05
  Administered 2021-03-05: 40 mg via INTRAVENOUS
  Administered 2021-03-05: 10 mg via INTRAVENOUS

## 2021-03-05 MED ORDER — ACETAMINOPHEN 500 MG TABLET
1000.0000 mg | ORAL_TABLET | Freq: Once | ORAL | Status: AC
Start: 2021-03-05 — End: 2021-03-05
  Administered 2021-03-05 (×2): 1000 mg via ORAL
  Filled 2021-03-05: qty 2

## 2021-03-05 MED ORDER — SODIUM CHLORIDE 0.9 % IRRIGATION SOLUTION
Freq: Once | Status: DC | PRN
Start: 2021-03-05 — End: 2021-03-05

## 2021-03-05 MED ORDER — EPINEPHRINE 1 MG/ML (1 ML) INJECTION SOLUTION
0.5000 mg | Freq: Once | INTRAMUSCULAR | Status: DC
Start: 2021-03-05 — End: 2021-03-05

## 2021-03-05 MED ORDER — MORPHINE 4 MG/ML INTRAVENOUS SOLUTION
4.0000 mg | INTRAVENOUS | Status: DC | PRN
Start: 2021-03-05 — End: 2021-03-09
  Administered 2021-03-05 – 2021-03-08 (×9): 4 mg via INTRAVENOUS
  Filled 2021-03-05 (×9): qty 1

## 2021-03-05 MED ORDER — EPINEPHRINE 1 MG/ML (1 ML) INJECTION SOLUTION
0.5000 mg | Freq: Once | INTRAMUSCULAR | Status: DC | PRN
Start: 2021-03-05 — End: 2021-03-09

## 2021-03-05 MED ORDER — PROCHLORPERAZINE EDISYLATE 10 MG/2 ML (5 MG/ML) INJECTION SOLUTION
5.0000 mg | Freq: Once | INTRAMUSCULAR | Status: DC | PRN
Start: 2021-03-05 — End: 2021-03-05

## 2021-03-05 MED ORDER — SODIUM CHLORIDE 0.9 % (FLUSH) INJECTION SYRINGE
3.0000 mL | INJECTION | Freq: Three times a day (TID) | INTRAMUSCULAR | Status: DC
Start: 2021-03-05 — End: 2021-03-09
  Administered 2021-03-05: 3 mL
  Administered 2021-03-06 – 2021-03-07 (×5): 0 mL
  Administered 2021-03-07 – 2021-03-08 (×3): 3 mL
  Administered 2021-03-08 – 2021-03-09 (×3): 0 mL

## 2021-03-05 MED ORDER — HYDRALAZINE 20 MG/ML INJECTION SOLUTION
5.0000 mg | Freq: Once | INTRAMUSCULAR | Status: DC | PRN
Start: 2021-03-05 — End: 2021-03-05

## 2021-03-05 MED ORDER — LEVETIRACETAM 250 MG TABLET
1250.0000 mg | ORAL_TABLET | Freq: Two times a day (BID) | ORAL | Status: DC
Start: 2021-03-05 — End: 2021-03-09
  Administered 2021-03-05 – 2021-03-09 (×9): 1250 mg via ORAL
  Filled 2021-03-05 (×8): qty 1

## 2021-03-05 MED ORDER — SODIUM CHLORIDE 0.9 % (FLUSH) INJECTION SYRINGE
3.0000 mL | INJECTION | INTRAMUSCULAR | Status: DC | PRN
Start: 2021-03-05 — End: 2021-03-05

## 2021-03-05 MED ORDER — LACTATED RINGERS INTRAVENOUS SOLUTION
INTRAVENOUS | Status: DC
Start: 2021-03-05 — End: 2021-03-05

## 2021-03-05 MED ORDER — MIDAZOLAM 1 MG/ML INJECTION WRAPPER
INTRAMUSCULAR | Status: AC
Start: 2021-03-05 — End: 2021-03-05
  Filled 2021-03-05: qty 2

## 2021-03-05 MED ORDER — TRAMADOL 50 MG TABLET
50.0000 mg | ORAL_TABLET | Freq: Four times a day (QID) | ORAL | Status: DC | PRN
Start: 2021-03-05 — End: 2021-03-09
  Administered 2021-03-05 – 2021-03-09 (×7): 50 mg via ORAL
  Filled 2021-03-05 (×6): qty 1

## 2021-03-05 MED ORDER — MEPERIDINE (PF) 50 MG/ML INJECTION SOLUTION
12.5000 mg | INTRAMUSCULAR | Status: DC | PRN
Start: 2021-03-05 — End: 2021-03-05

## 2021-03-05 MED ORDER — FENTANYL (PF) 50 MCG/ML INJECTION SOLUTION
25.0000 ug | INTRAMUSCULAR | Status: DC | PRN
Start: 2021-03-05 — End: 2021-03-05

## 2021-03-05 MED ORDER — HYDROMORPHONE (PF) 0.5 MG/0.5 ML INJECTION SYRINGE
0.5000 mg | INJECTION | INTRAMUSCULAR | Status: DC | PRN
Start: 2021-03-05 — End: 2021-03-05

## 2021-03-05 MED ORDER — SODIUM CHLORIDE 0.9 % (FLUSH) INJECTION SYRINGE
3.0000 mL | INJECTION | INTRAMUSCULAR | Status: DC | PRN
Start: 2021-03-05 — End: 2021-03-09

## 2021-03-05 MED ORDER — ACETAMINOPHEN 325 MG TABLET
650.0000 mg | ORAL_TABLET | ORAL | Status: DC | PRN
Start: 2021-03-05 — End: 2021-03-09
  Administered 2021-03-09: 650 mg via ORAL
  Filled 2021-03-05: qty 2

## 2021-03-05 MED ORDER — SUGAMMADEX 100 MG/ML INTRAVENOUS SOLUTION
INTRAVENOUS | Status: AC
Start: 2021-03-05 — End: 2021-03-05
  Filled 2021-03-05: qty 2

## 2021-03-05 MED ORDER — SODIUM CHLORIDE 0.9 % INTRAVENOUS PIGGYBACK
2.0000 g | Freq: Two times a day (BID) | INTRAVENOUS | Status: DC
Start: 2021-03-05 — End: 2021-03-09
  Administered 2021-03-05: 0 g via INTRAVENOUS
  Administered 2021-03-05 – 2021-03-06 (×2): 2 g via INTRAVENOUS
  Administered 2021-03-06 (×2): 0 g via INTRAVENOUS
  Administered 2021-03-06: 2 g via INTRAVENOUS
  Administered 2021-03-07: 0 g via INTRAVENOUS
  Administered 2021-03-07 (×2): 2 g via INTRAVENOUS
  Administered 2021-03-07: 0 g via INTRAVENOUS
  Administered 2021-03-08: 2 g via INTRAVENOUS
  Administered 2021-03-08: 0 g via INTRAVENOUS
  Administered 2021-03-08: 2 g via INTRAVENOUS
  Administered 2021-03-08: 0 g via INTRAVENOUS
  Filled 2021-03-05 (×8): qty 12.5

## 2021-03-05 MED ORDER — MAGNESIUM HYDROXIDE 400 MG/5 ML ORAL SUSPENSION
15.0000 mL | Freq: Four times a day (QID) | ORAL | Status: DC | PRN
Start: 2021-03-05 — End: 2021-03-09

## 2021-03-05 MED ORDER — LABETALOL 5 MG/ML INTRAVENOUS SOLUTION
5.0000 mg | INTRAVENOUS | Status: DC | PRN
Start: 2021-03-05 — End: 2021-03-05

## 2021-03-05 MED ORDER — ONDANSETRON HCL (PF) 4 MG/2 ML INJECTION SOLUTION
Freq: Once | INTRAMUSCULAR | Status: DC | PRN
Start: 2021-03-05 — End: 2021-03-05
  Administered 2021-03-05 (×2): 4 mg via INTRAVENOUS

## 2021-03-05 MED ORDER — DEXMEDETOMIDINE 4 MCG/ML IV DILUTION
Freq: Once | INTRAMUSCULAR | Status: DC | PRN
Start: 2021-03-05 — End: 2021-03-05
  Administered 2021-03-05 (×3): 12 ug via INTRAVENOUS

## 2021-03-05 MED ORDER — ONDANSETRON HCL (PF) 4 MG/2 ML INJECTION SOLUTION
4.0000 mg | Freq: Once | INTRAMUSCULAR | Status: DC | PRN
Start: 2021-03-05 — End: 2021-03-05

## 2021-03-05 MED ORDER — FENTANYL (PF) 50 MCG/ML INJECTION SOLUTION
INTRAMUSCULAR | Status: AC
Start: 2021-03-05 — End: 2021-03-05
  Filled 2021-03-05: qty 2

## 2021-03-05 MED ORDER — VANCOMYCIN 10 GRAM INTRAVENOUS SOLUTION
15.0000 mg/kg | Freq: Two times a day (BID) | INTRAVENOUS | Status: DC
Start: 2021-03-06 — End: 2021-03-09
  Administered 2021-03-06 (×2): 1500 mg via INTRAVENOUS
  Administered 2021-03-06 (×2): 0 mg via INTRAVENOUS
  Administered 2021-03-06: 1500 mg via INTRAVENOUS
  Administered 2021-03-06: 03:00:00 0 mg via INTRAVENOUS
  Administered 2021-03-07 (×2): 1500 mg via INTRAVENOUS
  Administered 2021-03-07: 0 mg via INTRAVENOUS
  Administered 2021-03-07: 1500 mg via INTRAVENOUS
  Administered 2021-03-07: 0 mg via INTRAVENOUS
  Administered 2021-03-08: 1500 mg via INTRAVENOUS
  Administered 2021-03-08: 0 mg via INTRAVENOUS
  Administered 2021-03-08: 1500 mg via INTRAVENOUS
  Administered 2021-03-08: 0 mg via INTRAVENOUS
  Administered 2021-03-08: 1500 mg via INTRAVENOUS
  Administered 2021-03-09: 0 mg via INTRAVENOUS
  Filled 2021-03-05 (×10): qty 15

## 2021-03-05 MED ORDER — EPINEPHRINE 1 MG/ML (1 ML) INJECTION SOLUTION
0.1000 mg | Freq: Once | INTRAMUSCULAR | Status: DC
Start: 2021-03-05 — End: 2021-03-05
  Filled 2021-03-05 (×2): qty 1

## 2021-03-05 MED ORDER — ALUMINUM-MAG HYDROXIDE-SIMETHICONE 200 MG-200 MG-20 MG/5 ML ORAL SUSP
20.0000 mL | ORAL | Status: DC | PRN
Start: 2021-03-05 — End: 2021-03-09

## 2021-03-05 MED ORDER — ONDANSETRON HCL (PF) 4 MG/2 ML INJECTION SOLUTION
4.0000 mg | Freq: Four times a day (QID) | INTRAMUSCULAR | Status: DC | PRN
Start: 2021-03-05 — End: 2021-03-09

## 2021-03-05 SURGICAL SUPPLY — 27 items
APPL 70% ISPRP 2% CHG 26ML CHLRPRP HI-LT ORNG PREP STRL LF  DISP CLR (MED SURG SUPPLIES) ×2 IMPLANT
BANDAGE 5.5YDX4IN STRL 2 SLFCL_S KNIT ELAS COTTON COMPRESS LF (WOUND CARE SUPPLY) ×1 IMPLANT
BANDAGE COFLX NL 5YDX1IN CHSV SLFADH COMPRESS TAN LF (WOUND CARE SUPPLY) IMPLANT
BANDAGE COFLX NL 5YDX2IN CHSV SLFADH COMPRESS TAN LF (WOUND CARE SUPPLY) IMPLANT
BANDAGE ESMARK 9FTX4IN STRL SYN COMPRESS LF (WOUND CARE SUPPLY) ×1 IMPLANT
BANDAGE MDCHC 5YDX2IN NONST 2_SLFCLS KNIT ELAS COTTON (WOUND CARE SUPPLY) ×1 IMPLANT
BLADE 15C BD CNV SS SURG TISS STRL LF  DISP (SURGICAL CUTTING SUPPLIES) ×1 IMPLANT
CONV USE 405185 - CUFF TOURNIQUET 18X4IN ATS CYL 2 PORT 1 BLADDER SLEEVE STRL LF  DISP (MED SURG SUPPLIES) IMPLANT
CONV USE ITEM 338638 - PACK SURG CRPL TNL DISP (CUSTOM TRAYS & PACK) ×1
CUFF TOURNIQUET 18X4IN ATS CYL 2 PORT 1 BLADDER SLEEVE STRL LF  DISP (MED SURG SUPPLIES)
DRAIN INCS .25IN 18IN PNRS SAF PIN XRY OPQ STRL LTX STD DISP (MED SURG SUPPLIES) ×1 IMPLANT
DRAPE ABS REINF ELAS FEN CNTCT CLSR 146X110X75IN HAND PRXM LF  STRL DISP SURG SMS 42X18IN (DRAPE/PACKS/SHEETS/OR TOWEL) ×1 IMPLANT
DRAPE SHEET 77X53IN .75 PRXM LF  STRL DISP SURG SMS BLU (DRAPE/PACKS/SHEETS/OR TOWEL) ×1 IMPLANT
DRESS 4X4IN TELFA PLSTR COTTON ABS ADH ILND NWVN BCK WOUND STRL LF (WOUND CARE SUPPLY) ×1 IMPLANT
DRESS PETRO 8X1IN CURAD XR COTTON NONADH OCL IMPREGNATE LF  STRL WHT (WOUND CARE SUPPLY) ×1 IMPLANT
DRESSING ISLAND 4X4 STD ADH_STR (WOUND CARE/ENTEROSTOMAL SUPPLY) ×1
GAUZE SURG 4X4IN STD RFDETECT COTTON 16 PLY XRY ABS LF  STRL DISP (WOUND CARE SUPPLY) ×1 IMPLANT
KIT PLS LAV PLSVC + FAN SPRAY (WOUND CARE SUPPLY) IMPLANT
PACK CARPAL TUNNEL - ~~LOC~~ (CUSTOM TRAYS & PACK) ×1 IMPLANT
PADDING CAST 4YDX2IN SPCLST RYN COTTON MICROPLEAT HI ABS NONST LF (ORTHOPEDICS (NOT IMPLANTS)) ×1 IMPLANT
PADDING CAST 4YDX4IN WEBRIL COTTON STRL LF (ORTHOPEDICS (NOT IMPLANTS)) ×1 IMPLANT
PEN SURG MRKNG DVN SKIN DISP NONSMEAR REG TIP FLXB RLR LBL GNTN VIOL STRL LF (MED SURG SUPPLIES) ×1 IMPLANT
PEN SURG MRKNG DVN SKN DISP NO_NSMEAR REG TIP FLXB RLR LBL (MED SURG SUPPLIES) ×1
STRIP 5YDX.25IN COTTON GAUZE WOUND CURAD WOVEN STRL LF (WOUND CARE SUPPLY) ×1 IMPLANT
SWAB BD BBL BD CLTSWB LIQUID STRT MED RYN TMPR EVD SEAL 2 ACT CAP RND BTM TUBE 5.25IN STRL CULT LF (MISCELLANEOUS PT CARE ITEMS) ×3 IMPLANT
SWAB BD VSWB COTTON RND END PN_T TIP 3IN STRL CULT LF OPTH (MISCELLANEOUS PT CARE ITEMS) ×3
SWAB VSWB COTTON PNT TIP RND 3IN STRL CULT LF (MISCELLANEOUS PT CARE ITEMS) ×3 IMPLANT

## 2021-03-05 NOTE — Brief Op Note (Signed)
Brief Op Note    Preop dx: Left middle finger flexor tenosynovitis    Postop diagnosis: Left middle finger flexor tenosynovitis    Procedure: Left middle finger irrigation and debridement with flexor tenosynovectomy    Surgeon: Thelma Barge, MD    Anesthesia:  General    EBL: 2cc    Complications: None apparent    Condition: Good    Plan:  -Frequent finger ROM  -Elevate hand above elbow  -Dressing to be changed in whirlpool tomorrow.  -vancomycin and cefepime for antibiotics.  -ID consult  Follow intraoperative cultures

## 2021-03-05 NOTE — H&P (Signed)
PATIENT NAME: Jorge Maddox, Jorge Maddox  HOSPITAL NUMBER:  [id number]  DATE OF SERVICE: 03/05/2021  DATE OF BIRTH:  10/08/1985    CLINIC RETURN/FOLLOW UP    CHIEF COMPLAINT:  Left hand pressure washer injury.    Preoperative diagnosis:  Left middle finger flexor tenosynovitis    Planned surgical procedure:  Left hand irrigation and debridement    History of present illness:  Mr. Roseboom returns to clinic today.  He is following up on a pressure washer injury with water to his left hand on March 02, 2021.  He has noted a decrease in swelling, but increase in pain in his left middle finger.  He reports an improvement in his numbness and tingling.  He has been taking his doxycycline as instructed.     Past Medical History  No current outpatient medications on file.     Allergies   Allergen Reactions   . Biaxin [Clarithromycin] Nausea/ Vomiting   . Iv Contrast    . Oxycodone Mental Status Effect     Past Medical History:   Diagnosis Date   . Migraine Headaches 08/19/1997   . Seizures          Family Medical History:    None         Social History     Socioeconomic History   . Marital status: Married   Tobacco Use   . Smoking status: Never Smoker   . Smokeless tobacco: Never Used   Substance and Sexual Activity   . Alcohol use: Yes     Alcohol/week: 1.7 standard drinks     Types: 2 Cans of beer per week     Comment: occ   . Drug use: No       OBJECTIVE:  In general, Mr. Arzuaga is a well-appearing gentleman.  He is awake, alert, and oriented x3.  He has normal affect.  Examination of his left upper extremity reveals him to have a transverse laceration in the space between the middle and ring finger metacarpals volarly.  He has some erythema and swelling at the base of the middle finger.  He has intact sensation to light touch in the thumb, index, ring, and small fingers.  Slightly diminished sensation to light touch in the middle finger.  However, 2-point discrimination of 5 mm in all digits.  He has intact flexor tendon firing to  the middle finger with a flicker of FDP motion and some FDS motion.  The resting cascade of his hand is normal.  He has no significant pain with passive range of motion of the thumb, index, ring, and small fingers.  He has significant pain with passive extension of the left middle finger.    ASSESSMENT:  Left middle finger flexor tenosynovitis after high pressure washer injury.    PLAN:  I discussed with the patient the diagnosis of flexor tenosynovitis in detail.  We discussed need to take him to surgery for washout as he has already been on outpatient antibiotics and has had increasing swelling and pain despite these measures.  We discussed what surgery entails.  We discussed the need for postoperative packing.  We discussed the need for general anesthesia and a hospital admission for IV antibiotics postoperatively.  We discussed the risks of surgery in detail which include but are not limited to bleeding, infection, damage to nerves and blood vessels, pain, stiffness, failure to relieve symptoms completely, need for additional surgeries, as well as the risks of anesthesia.  Knowing these risks, the  patient elected to proceed.         Dicie Beam, MD  [facility]

## 2021-03-05 NOTE — Pharmacy (Signed)
 Medicine / Department of Pharmaceutical Services  Therapeutic Drug Monitoring: Vancomycin  03/05/2021      Patient name: Jorge Maddox, Jorge Maddox  Date of Birth:  1986/04/23    Actual Weight:  Weight: 106 kg (233 lb 1.6 oz) (03/05/21 1715)     BMI:  BMI (Calculated): 30.82 (03/05/21 1715)      Date RPh Current regimen (including mg/kg) Indication &  Organism AUC or trough based dosing Target Levels^ SCr (mg/dL) CrCl* (mL/min) Infectious Laboratory Markers (as applicable)   Measured level(s)   (mcg/mL) Calculated AUC (if AUC based monitoring) Plan & predicted AUC/trough if initial dosing (including when levels are due) Comments   8/26 TEC Vanc 1500mg  IV Q12H SSTI AUC 400-600 0.94 142.8 WBC:  Procal:  CRP:   received Vanc 1750mg  IV x1 dose in ED @1234 ; will start Vanc 1500mg  Q12H with levels after 4 doses                                                                                                     ^Target levels depends on dosing and monitoring method, AUC vs. trough based. For AUC based dosing units are mg*h/L. For trough based dosing units are mcg/mL.     *Creatinine clearance is estimated by using the Cockcroft-Gault equation for adult patients and the for pediatric patients.    The decision to discontinue vancomycin therapy will be determined by the primary service.  Please contact the pharmacist with any questions regarding this patient's medication regimen.

## 2021-03-05 NOTE — Progress Notes (Signed)
PATIENT NAME: Jorge Maddox, Jorge Maddox  HOSPITAL NUMBER:  [id number]  DATE OF SERVICE: 03/05/2021  DATE OF BIRTH:  Aug 12, 1985    CLINIC RETURN/FOLLOW UP    CHIEF COMPLAINT:  Left hand pressure washer injury.    SUBJECTIVE:  Mr. Jorge Maddox returns to clinic today.  He is following up on a pressure washer injury with water to his left hand on March 02, 2021.  He has noted a decrease in swelling, but increase in pain in his left middle finger.  He reports an improvement in his numbness and tingling.  He has been taking his doxycycline as instructed.     Past medical history, past surgical history, medications, allergies, family history, and social history reviewed in Epic.    OBJECTIVE:  In general, Mr. Jorge Maddox is a well-appearing gentleman.  He is awake, alert, and oriented x3.  He has normal affect.  Examination of his left upper extremity reveals him to have a transverse laceration in the space between the middle and ring finger metacarpals volarly.  He has some erythema and swelling at the base of the middle finger.  He has intact sensation to light touch in the thumb, index, ring, and small fingers.  Slightly diminished sensation to light touch in the middle finger.  However, 2-point discrimination of 5 mm in all digits.  He has intact flexor tendon firing to the middle finger with a flicker of FDP motion and some FDS motion.  The resting cascade of his hand is normal.  He has no significant pain with passive range of motion of the thumb, index, ring, and small fingers.  He has significant pain with passive extension of the left middle finger.    ASSESSMENT:  Left middle finger flexor tenosynovitis after high pressure washer injury.    PLAN:  I discussed with the patient the diagnosis of flexor tenosynovitis in detail.  We discussed need to take him to surgery for washout as he has already been on outpatient antibiotics and has had increasing swelling and pain despite these measures.  We discussed what surgery entails.  We  discussed the need for postoperative packing.  We discussed the need for general anesthesia and a hospital admission for IV antibiotics postoperatively.  We discussed the risks of surgery in detail which include but are not limited to bleeding, infection, damage to nerves and blood vessels, pain, stiffness, failure to relieve symptoms completely, need for additional surgeries, as well as the risks of anesthesia.  Knowing these risks, the patient elected to proceed.     We will send the patient over to the Emergency Department for admission to the hospital and an urgent surgery on his left hand.        Dicie Beam, MD  [facility]              DD:  03/05/2021 10:27:22  DT:  03/05/2021 10:42:01 NW  D#:  [id number]

## 2021-03-05 NOTE — ED Nurses Note (Addendum)
Report given to Santina Evans, RN in OPS. Pt dressed in gown. No acute distress present

## 2021-03-05 NOTE — ED Nurses Note (Signed)
Report received from Brian, RN. Care assumed at this time

## 2021-03-05 NOTE — ED Nurses Note (Signed)
Pt resting on stretcher. Respirations even and unlabored. No acute distress present.

## 2021-03-05 NOTE — ED Nurses Note (Signed)
Pt to OPS with ED tech

## 2021-03-05 NOTE — Discharge Instructions (Addendum)
Admitted hospitalist.

## 2021-03-05 NOTE — ED Provider Notes (Signed)
Department of Emergency Medicine  HPI - 03/05/2021      Resident/Advanced Practice Provider: Franco Nones, NP-C   Attending: Dr. Freddi Che    Chief Complaint:   Hand Pain  History of Present Illness:   Jorge Maddox, 35 y.o. male   Pt reports to the ED via private vehicle with c/o L hand pain.     Pt reports cleaning up work equipment on 03/02/21 with a pressure washer when it struck his L hand. He was seen in the ED and was discharged with doxycycline.    He notes being seen by orthopedics on 03/03/21 and they were going to cut the hand open but Dr. Horton Chin stated he would see the pt on Friday.     Pt was seen by Dr.Dahl this morning and was told to come to the ED for admission. Dr. Horton Chin is going to take the pt to surgery later today.   He denies fever, chills, nausea, vomiting, and any other complaints or concerns at this time.    PMHx seizures.     History Limitations: None    Review of Systems:   Constitutional: No fever, chills, or weakness   Skin: No rashes or diaphoresis   HENT: No congestion   Eyes: No vision changes or discharge   Cardio: No chest pain, palpitations, or leg swelling    Respiratory: No cough, wheezing, or SOB   GI:  No nausea, vomiting, diarrhea, constipation, or abdominal pain   GU:  No dysuria, hematuria, or polyuria   MSK: No back pain  +L hand pain   Neuro: No loss of sensation, focal deficits, headaches, or LOC  All other systems reviewed and are negative.    Medications:  Prior to Admission Medications   Prescriptions Last Dose Informant Patient Reported? Taking?   HYDROcodone-acetaminophen (NORCO) 5-325 mg Oral Tablet   No No   Sig: Take 1 Tablet by mouth Every 6 hours as needed for Pain   doxycycline 100 mg Oral Tablet   No No   Sig: Take 1 Tablet (100 mg total) by mouth Twice daily for 7 days   levETIRAcetam (KEPPRA) 1,000 mg Oral Tablet   Yes No   Sig: Take 1,250 mg by mouth Twice daily      Facility-Administered Medications: None       Allergies:  Allergies    Allergen Reactions   . Horseradish Anaphylaxis   . Biaxin [Clarithromycin] Nausea/ Vomiting   . Iv Contrast    . Oxycodone Mental Status Effect   . Peanuts [Peanut] Itching       Past Medical History:  Past Medical History:   Diagnosis Date   . Migraine Headaches 08/19/1997   . Seizures            Past Surgical History:  No pertinent PSH reported.          Social History:  Social History     Socioeconomic History   . Marital status: Married     Spouse name: Not on file   . Number of children: Not on file   . Years of education: Not on file   . Highest education level: Not on file   Occupational History   . Not on file   Tobacco Use   . Smoking status: Never Smoker   . Smokeless tobacco: Never Used   Substance and Sexual Activity   . Alcohol use: Yes     Alcohol/week: 1.7 standard drinks     Types:  2 Cans of beer per week     Comment: occ   . Drug use: No   . Sexual activity: Not on file   Other Topics Concern   . Not on file   Social History Narrative   . Not on file     Social Determinants of Health     Financial Resource Strain: Not on file   Food Insecurity: Not on file   Transportation Needs: Not on file   Physical Activity: Not on file   Stress: Not on file   Intimate Partner Violence: Not on file   Housing Stability: Not on file       Family History:  Family Medical History:    None           Physical Exam:  All nurse's notes reviewed.  Filed Vitals:    03/05/21 1615 03/05/21 1630 03/05/21 1641 03/05/21 1715   BP: 129/86 133/85  120/79   Pulse: 83 71  68   Resp: '14 13 18 19   ' Temp:    36.7 C (98.1 F)   SpO2: 99% 96% 94% 95%          Constitutional: NAD. A+Ox3   HENT:    Head: NC AT    Mouth/Throat: Oropharynx is clear and moist.    Eyes: PERRL, EOMI, Conjunctivae without discharge.   Neck: Supple.    Cardiovascular: RRR, No murmurs, rubs, or gallops.    Pulmonary/Chest: BS equal bilaterally, good air movement. No respiratory distress. No wheezes, rales, or chest tenderness.    Abdominal: BS +.  Abdomen soft. No tenderness, rebound, or guarding.                Musculoskeletal: No obvious deformity or swelling noted. Limited flexion and extension distally to L hand.     Skin: Warm and dry. No rash, erythema, pallor, or cyanosis. 1cm skin tear/laceration to palmer aspect of L hand between 3 MCP joint with surrounding erythema and edema.     Psychiatric: Behavior is normal. Mood and affect congruent.     Neurological: Alert&Ox3. Grossly intact.     Labs:  Results for orders placed or performed during the hospital encounter of 03/05/21 (from the past 24 hour(s))   BASIC METABOLIC PANEL   Result Value Ref Range    SODIUM 139 135 - 145 mmol/L    POTASSIUM 4.1 3.5 - 5.0 mmol/L    CHLORIDE 103 98 - 111 mmol/L    CO2 TOTAL 30 21 - 35 mmol/L    ANION GAP 6 mmol/L    CALCIUM 9.5 8.8 - 10.3 mg/dL    GLUCOSE 92 70 - 110 mg/dL    BUN 11 10 - 25 mg/dL    CREATININE 0.94 <=1.30 mg/dL    BUN/CREA RATIO 12     ESTIMATED GFR >60 Avg: 107 mL/min/1.34m2    Narrative    Estimated Glomerular Filtration Rate (eGFR) is calculated using the CKD-EPI (2021) equation, intended for patients 149years of age and older. If gender is not documented or "unknown", there will be no eGFR calculation.   CBC/DIFF    Narrative    The following orders were created for panel order CBC/DIFF.  Procedure                               Abnormality         Status                     ---------                               -----------         ------  CBC WITH BJSE[[id number]]                                    Final result                 Please view results for these tests on the individual orders.   CBC WITH DIFF   Result Value Ref Range    WBC 6.2 3.7 - 11.0 x10^3/uL    RBC 4.92 4.50 - 6.10 x10^6/uL    HGB 15.1 13.4 - 17.5 g/dL    HCT 45.5 38.9 - 52.0 %    MCV 92.5 78.0 - 100.0 fL    MCH 30.7 26.0 - 32.0 pg    MCHC 33.2 31.0 - 35.5 g/dL    RDW-CV 12.4 11.5 - 15.5 %    PLATELETS 239 150 - 400 x10^3/uL    MPV 10.9 8.7 - 12.5 fL     NEUTROPHIL % 67 %    LYMPHOCYTE % 19 %    MONOCYTE % 11 %    EOSINOPHIL % 2 %    BASOPHIL % 0 %    NEUTROPHIL # 4.19 1.50 - 7.70 x10^3/uL    LYMPHOCYTE # 1.19 1.00 - 4.80 x10^3/uL    MONOCYTE # 0.66 0.20 - 1.10 x10^3/uL    EOSINOPHIL # 0.11 <=0.50 x10^3/uL    BASOPHIL # <0.10 <=0.20 x10^3/uL    IMMATURE GRANULOCYTE % 1 0 - 1 %    IMMATURE GRANULOCYTE # <0.10 <0.10 x10^3/uL       Imaging:  None Indicated          Orders Placed This Encounter   . ANAEROBIC CULTURE   . FUNGUS CULTURE   . AFB CULTURE WITH STAIN   . STERILE SITE CULTURE AND GRAM STAIN, AEROBIC   . ANAEROBIC CULTURE   . FUNGUS CULTURE   . TISSUE CULTURE (AEROBIC CULT & GRAM STAIN)   . AFB CULTURE WITH STAIN   . BASIC METABOLIC PANEL   . CBC/DIFF   . CBC WITH DIFF   . CBC/DIFF   . VANCOMYCIN PEAK   . VANCOMYCIN, TROUGH   . PT EVALUATE AND TREAT   . CANCELED: OXYGEN - NASAL CANNULA   . CANCELED: PERFORM POC WHOLE BLOOD GLUCOSE ONCE IF PATIENT DIABETIC   . CANCELED: INSERT & MAINTAIN PERIPHERAL IV ACCESS   . CANCELED: PERIPHERAL IV DRESSING CHANGE   . INSERT & MAINTAIN PERIPHERAL IV ACCESS   . PERIPHERAL IV DRESSING CHANGE   . PATIENT CLASS/LEVEL OF CARE DESIGNATION - [place]   . vancomycin (VANCOCIN) 1,750 mg in NS 500 mL IVPB   . acetaminophen (TYLENOL) tablet   . Vancomycin IV - Pharmacist to Dose per Protocol   . vancomycin (VANCOCIN) 1,500 mg in NS 250 mL IVPB   . cefepime (MAXIPIME) 2 g in NS 50 mL IVPB minibag   . levETIRAcetam (KEPPRA) tablet 1,250 mg   . NS flush syringe   . NS flush syringe   . albuterol (PROVENTIL) 2.5 mg / 3 mL (0.083%) neb solution   . aluminum-magnesium hydroxide-simethicone (MAG-AL PLUS) 200-200-20 mg per 5 mL oral liquid   . magnesium hydroxide (MILK OF MAGNESIA) 426m per 510moral liquid   . ondansetron (ZOFRAN) 2 mg/mL injection   . morphine 4 mg/mL injection   . diphenhydrAMINE (BENADRYL) 50 mg/mL injection   . EPINEPHrine (ADRENALIN) 1 mg/mL injection   . acetaminophen (TYLENOL) tablet   .  traMADol (ULTRAM) tablet   .  SURGICAL PATHOLOGY SPECIMEN       Abnormal Lab results:  Labs Reviewed   BASIC METABOLIC PANEL    Narrative:     Estimated Glomerular Filtration Rate (eGFR) is calculated using the CKD-EPI (2021) equation, intended for patients 77 years of age and older. If gender is not documented or "unknown", there will be no eGFR calculation.   CBC/DIFF    Narrative:     The following orders were created for panel order CBC/DIFF.  Procedure                               Abnormality         Status                     ---------                               -----------         ------                     CBC WITH WOEH[[id number]]                                    Final result                 Please view results for these tests on the individual orders.   CBC WITH DIFF       Plan: Appropriate labs and imaging ordered. Medical Records reviewed.  ED Procedures:  Procedures - not performed.      Therapy/Procedures/Course/MDM:   . Patient was vitally stable throughout visit.   . Imaging remarkable for: As above  . Lab results remarkable for: As above   . Patient received: See MAR   . Ortho aware of patient prior to arrival   . Hospitalist admit  . Results discussed with patient.  he had improvement with initial ED management. he was given the opportunity to ask questions.    Consults:    Hospitalist   Impression:   Encounter Diagnoses   Name Primary?   . Cellulitis, unspecified cellulitis site Yes   . Hand pain    . Swelling of hand, unspecified laterality      Disposition:  Admitted     Patient will be admitted to hospitalist service for further evaluation and management.           I am scribing for, and in the presence of Franco Nones, APRN, NP-C for services provided on 03/05/2021.  [place]ek[place]a Whitehair, [place], [place]  03/05/2021, 10:55    I personally performed the services described in this documentation, as scribed  in my presence, and it is both accurate  and complete.    Fabio Asa, APRN,NP-C    The  supervising physician was physically present and available for consultation, and did not physically see the patient.    Fabio Asa, APRN,NP-C  03/05/2021, 21:25

## 2021-03-05 NOTE — Anesthesia Preprocedure Evaluation (Addendum)
ANESTHESIA PRE-OP EVALUATION  Planned Procedure: IRRIGATION AND DEBRIDEMENT HAND (Left Hand)  Review of Systems     anesthesia history negative     patient summary reviewed          Pulmonary  negative pulmonary ROS,    Cardiovascular  negative cardio ROS,   No peripheral edema,        GI/Hepatic/Renal   negative GI/hepatic/renal ROS,         Endo/Other   neg endo/other ROS,       Neuro/Psych/MS    seizures, well controlled and headaches     Cancer                      Physical Assessment      Airway       Mallampati: III    TM distance: >3 FB    Neck ROM: full  Mouth Opening: good.  No Facial hair  No Beard        Dental       Dentition intact             Pulmonary    Breath sounds clear to auscultation  (-) no rhonchi, no decreased breath sounds, no wheezes, no rales and no stridor     Cardiovascular    Rhythm: regular  Rate: Normal  (-) no friction rub, carotid bruit is not present, no peripheral edema and no murmur     Other findings            Plan  ASA 2     Planned anesthesia type: general     general anesthesia with endotracheal tube intubation    plan to administer opioids postoperatively            PONV/POV Plan:  I plan to administer pharmcologic prophalaxis antiemetics  Intravenous induction     Anesthesia issues/risks discussed are: Dental Injuries, PONV, Difficult Airway, Intraoperative Awareness/ Recall, Post-op Agitation/Tantrum, Sore Throat, Aspiration and Cardiac Events/MI.  Anesthetic plan and risks discussed with patient               NPO Status: Full stomach precautions.         Plan discussed with CRNA.    (Pt with coffee and cream at 0800.    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      GETA planned)

## 2021-03-05 NOTE — Anesthesia Transfer of Care (Signed)
ANESTHESIA TRANSFER OF CARE   Jorge Maddox is a 35 y.o. ,male, Weight: 104 kg (229 lb 4.5 oz)   had Procedure(s):  IRRIGATION AND DEBRIDEMENT HAND  performed  03/05/21   Primary Service: Dicie Beam, MD    Past Medical History:   Diagnosis Date   . Migraine Headaches 08/19/1997   . Seizures       Allergy History as of 03/05/21     CLARITHROMYCIN       Noted Status Severity Type Reaction    04/02/08 Lesleigh Noe, RN 04/02/08 Active   Nausea/ Vomiting          HYDROCODONE       Noted Status Severity Type Reaction    05/31/20 [address], [place], [place] 05/31/20 Deleted       05/31/20 [address], [place], RN 05/31/20 Active             OXYCODONE       Noted Status Severity Type Reaction    05/31/20 [address] [place], [place] 05/31/20 Active   Mental Status Effect          IV CONTRAST       Noted Status Severity Type Reaction    02/17/21 1938 Bellair, Bella Kennedy, LPN 26/20/35 Active                 I completed my transfer of care / handoff to the receiving personnel during which we discussed:  Access, Airway, All key/critical aspects of case discussed, Analgesia, Antibiotics, Expectation of post procedure, Fluids/Product, Gave opportunity for questions and acknowledgement of understanding, Labs and PMHx    Post Location: PACU                                                                  Last OR Temp: Temperature: 36.6 C (97.9 F)  ABG:  POTASSIUM   Date Value Ref Range Status   03/05/2021 4.1 3.5 - 5.0 mmol/L Final   04/03/2008 3.7 3.5 - 5.1 mmol/L Final     CALCIUM   Date Value Ref Range Status   03/05/2021 9.5 8.8 - 10.3 mg/dL Final   59/74/1638 9.3 8.5 - 10.4 mg/dL Final     Airway:* No LDAs found *  Blood pressure 126/86, pulse 74, temperature 36.6 C (97.9 F), resp. rate 14, height 1.829 m (6'), weight 104 kg (229 lb 4.5 oz), SpO2 99 %.

## 2021-03-05 NOTE — H&P (Signed)
[facility]  Hospitalist Admission H&P    Unknown, Schleyer, 35 y.o. male  Date of Admission:  03/05/2021  Date of Birth:  05-18-1986    Information Obtained from: patient  Chief Complaint:  Left hand swelling    HPI: Jorge Maddox is a 35 y.o., White male who presents with left hand swelling after he injured his left hand using industrial power wash several days prior.  Seen in the ED and discharged with oral antibiotics to see the hand surgeon.  He was then referred to come through the ER for irrigation and debridement.  Patient denies fevers or chills.  He has been taking antibiotics.  Pain and swelling of the left hand has been increasing.  He has no prior history of MRSA infections.  He works in the [employer], community.    Past Medical History:   Diagnosis Date   . Migraine Headaches 08/19/1997   . Seizures          Past Surgical History was reviewed and is negative for Major surgery      Medications Prior to Admission     Prescriptions    doxycycline 100 mg Oral Tablet    Take 1 Tablet (100 mg total) by mouth Twice daily for 7 days    HYDROcodone-acetaminophen (NORCO) 5-325 mg Oral Tablet    Take 1 Tablet by mouth Every 6 hours as needed for Pain    levETIRAcetam (KEPPRA) 1,000 mg Oral Tablet    Take 1,250 mg by mouth Twice daily        Allergies   Allergen Reactions   . Biaxin [Clarithromycin] Nausea/ Vomiting   . Iv Contrast    . Oxycodone Mental Status Effect     Social History     Socioeconomic History   . Marital status: Married   Tobacco Use   . Smoking status: Never Smoker   . Smokeless tobacco: Never Used   Substance and Sexual Activity   . Alcohol use: Yes     Alcohol/week: 1.7 standard drinks     Types: 2 Cans of beer per week     Comment: occ   . Drug use: No     Past Family History:     Family Medical History:    None     Not pertinent to the presentation        ROS: Other than ROS in the HPI, all other systems were negative.    EXAM:  Temperature: 36.7 C (98.1 F)  Heart Rate:  68  BP (Non-Invasive): 120/79  Respiratory Rate: 19  SpO2: 95 %  Constitutional: appears in good health  Eyes: Conjunctiva clear.  ENT: ENMT without erythema or injection, mucous membranes moist.  Neck: no thyromegaly or lymphadenopathy and supple, symmetrical, trachea midline  Respiratory: Clear to auscultation bilaterally.   Cardiovascular: regular rate and rhythm  Gastrointestinal: Soft, non-tender, Bowel sounds normal  Genitourinary:  Deferred  Musculoskeletal: Swelling of palm are and dorsal aspect of the left hand with an open wound in the base of the 3rd/middle finger.  There is swelling and fluctuance of the entire hand.      LABS:    I have reviewed all lab results.  Lab Results Today:    Results for orders placed or performed during the hospital encounter of 03/05/21 (from the past 24 hour(s))   BASIC METABOLIC PANEL   Result Value Ref Range    SODIUM 139 135 - 145 mmol/L  POTASSIUM 4.1 3.5 - 5.0 mmol/L    CHLORIDE 103 98 - 111 mmol/L    CO2 TOTAL 30 21 - 35 mmol/L    ANION GAP 6 mmol/L    CALCIUM 9.5 8.8 - 10.3 mg/dL    GLUCOSE 92 70 - 332 mg/dL    BUN 11 10 - 25 mg/dL    CREATININE 9.51 <=8.84 mg/dL    BUN/CREA RATIO 12     ESTIMATED GFR >60 Avg: 107 mL/min/1.44m^2   CBC WITH DIFF   Result Value Ref Range    WBC 6.2 3.7 - 11.0 x10^3/uL    RBC 4.92 4.50 - 6.10 x10^6/uL    HGB 15.1 13.4 - 17.5 g/dL    HCT 16.6 06.3 - 01.6 %    MCV 92.5 78.0 - 100.0 fL    MCH 30.7 26.0 - 32.0 pg    MCHC 33.2 31.0 - 35.5 g/dL    RDW-CV 01.0 93.2 - 35.5 %    PLATELETS 239 150 - 400 x10^3/uL    MPV 10.9 8.7 - 12.5 fL    NEUTROPHIL % 67 %    LYMPHOCYTE % 19 %    MONOCYTE % 11 %    EOSINOPHIL % 2 %    BASOPHIL % 0 %    NEUTROPHIL # 4.19 1.50 - 7.70 x10^3/uL    LYMPHOCYTE # 1.19 1.00 - 4.80 x10^3/uL    MONOCYTE # 0.66 0.20 - 1.10 x10^3/uL    EOSINOPHIL # 0.11 <=0.50 x10^3/uL    BASOPHIL # <0.10 <=0.20 x10^3/uL    IMMATURE GRANULOCYTE % 1 0 - 1 %    IMMATURE GRANULOCYTE # <0.10 <0.10 x10^3/uL       Radiology Results:   No orders  to display       Immunization History   Administered Date(s) Administered   . Covid-19 Vaccine,Pfizer-BioNTech,Purple Top,17yrs+ 10/04/2019, 10/24/2019, 11/17/2020   . Tetanus Toxoid/Diphtheria Toxoid/Acellular Pertussis Vaccine, Adsorbed (Tdap) 03/02/2021       DNR Status:  Prior      ASSESSMENT:    Active Hospital Problems    Diagnosis   . Hand abscess   . Juvenile myoclonic epilepsy (CMS HCC)     DVT/PE Prophylaxis: SCDs/ Venodynes/Impulse boots  Disposition Planning: Home discharge     PLAN:    1. Hand abscess/right middle finger-flexor tenosynovitis-status post irrigation and drainage today.  Continue vancomycin and cefepime.  Wound care orders per Dr. Celedonio Savage.  -ordered pain control at this time.    2. 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  -reported last seizure 3 and half years ago    DVT prophylaxis-not required    Max Fickle, MD

## 2021-03-05 NOTE — Care Plan (Signed)
Patient is alert and oriented. I&D to the left middle finger, with Dr. Celedonio Savage. Ambulates standby assistance. Meals and medication tolerated well. Dressing to the left hand is Clean dry and intact. No complaints of pain. Call bell is in reach and alarms are present for patient safety. Patient will continue to be monitored for any changes in condition.      Kalman Jewels, LPN 9/67/8938, 10:17      Problem: Adult Inpatient Plan of Care  Goal: Plan of Care Review  Outcome: Ongoing (see interventions/notes)     Problem: Infection (Surgery Nonspecified)  Goal: Absence of Infection Signs and Symptoms  Outcome: Ongoing (see interventions/notes)     Problem: Pain (Surgery Nonspecified)  Goal: Acceptable Pain Control  Outcome: Ongoing (see interventions/notes)

## 2021-03-05 NOTE — Anesthesia Postprocedure Evaluation (Signed)
Anesthesia Post Op Evaluation    Patient: Jorge Maddox  Procedure(s):  IRRIGATION AND DEBRIDEMENT HAND    Last Vitals:Temperature: 36.6 C (97.9 F) (03/05/21 1604)  Heart Rate: 71 (03/05/21 1630)  BP (Non-Invasive): 133/85 (03/05/21 1630)  Respiratory Rate: 18 (03/05/21 1641)  SpO2: 94 % (03/05/21 1641)    No complications documented.    Patient is sufficiently recovered from the effects of anesthesia to participate in the evaluation and has returned to their pre-procedure level.  Patient location during evaluation: PACU       Patient participation: complete - patient participated  Level of consciousness: awake and alert and responsive to verbal stimuli    Pain management: adequate  Airway patency: patent    Anesthetic complications: no  Cardiovascular status: acceptable  Respiratory status: acceptable  Hydration status: acceptable  Patient post-procedure temperature: Pt Normothermic   PONV Status: Absent

## 2021-03-06 DIAGNOSIS — Z9889 Other specified postprocedural states: Secondary | ICD-10-CM

## 2021-03-06 DIAGNOSIS — R519 Headache, unspecified: Secondary | ICD-10-CM

## 2021-03-06 DIAGNOSIS — L02512 Cutaneous abscess of left hand: Principal | ICD-10-CM

## 2021-03-06 LAB — CBC WITH DIFF
BASOPHIL #: <0.1 10*3/uL (ref <=0.20)
BASOPHIL %: 0 %
EOSINOPHIL #: <0.1 10*3/uL (ref <=0.50)
EOSINOPHIL %: 0 %
HCT: 43.8 % (ref 38.9–52.0)
HGB: 15 g/dL (ref 13.4–17.5)
IMMATURE GRANULOCYTE #: <0.1 10*3/uL (ref <0.10)
IMMATURE GRANULOCYTE %: 0 % (ref 0–1)
LYMPHOCYTE #: 1.19 10*3/uL (ref 1.00–4.80)
LYMPHOCYTE %: 11 %
MCH: 31.4 pg (ref 26.0–32.0)
MCHC: 34.2 g/dL (ref 31.0–35.5)
MCV: 91.8 fL (ref 78.0–100.0)
MONOCYTE #: 0.86 10*3/uL (ref 0.20–1.10)
MONOCYTE %: 8 %
MPV: 11.2 fL (ref 8.7–12.5)
NEUTROPHIL #: 9.06 10*3/uL — ABNORMAL HIGH (ref 1.50–7.70)
NEUTROPHIL %: 81 %
PLATELETS: 257 10*3/uL (ref 150–400)
RBC: 4.77 10*6/uL (ref 4.50–6.10)
RDW-CV: 12.2 % (ref 11.5–15.5)
WBC: 11.2 10*3/uL — ABNORMAL HIGH (ref 3.7–11.0)

## 2021-03-06 LAB — ECG 12 LEAD - ADULT
Atrial Rate: 71 {beats}/min
Calculated P Axis: 59 degrees
Calculated R Axis: 70 degrees
Calculated T Axis: 37 degrees
PR Interval: 140 ms
QRS Duration: 98 ms
QT Interval: 392 ms
QTC Calculation: 425 ms
Ventricular rate: 71 {beats}/min

## 2021-03-06 LAB — STERILE SITE CULTURE AND GRAM STAIN, AEROBIC: GRAM STAIN: NONE SEEN

## 2021-03-06 MED ORDER — METRONIDAZOLE 500 MG TABLET
500.0000 mg | ORAL_TABLET | Freq: Two times a day (BID) | ORAL | Status: DC
Start: 2021-03-06 — End: 2021-03-09
  Administered 2021-03-06 – 2021-03-08 (×6): 500 mg via ORAL
  Filled 2021-03-06 (×7): qty 1

## 2021-03-06 MED ORDER — MELATONIN 3 MG TABLET
9.0000 mg | ORAL_TABLET | Freq: Every evening | ORAL | Status: DC
Start: 2021-03-06 — End: 2021-03-09
  Administered 2021-03-06 – 2021-03-08 (×3): 9 mg via ORAL
  Filled 2021-03-06 (×3): qty 3

## 2021-03-06 NOTE — Progress Notes (Signed)
[facility]  Hospitalist progress note    Jorge Maddox, Jorge Maddox, 35 y.o. male  Date of Admission:  03/05/2021  Date of Birth:  February 20, 1986    Information Obtained from: patient  Chief Complaint:  Left hand swelling    Subjective-resting comfortably.  Patient with dressing change done earlier today.  Complains of some pain and headache.  Otherwise no new complaints.  Pain controlled with IV morphine p.r.n.Marland Kitchen    Past Medical History:   Diagnosis Date   . Migraine Headaches 08/19/1997   . Seizures          Past Surgical History was reviewed and is negative for Major surgery      Medications Prior to Admission     Prescriptions    doxycycline 100 mg Oral Tablet    Take 1 Tablet (100 mg total) by mouth Twice daily for 7 days    HYDROcodone-acetaminophen (NORCO) 5-325 mg Oral Tablet    Take 1 Tablet by mouth Every 6 hours as needed for Pain    levETIRAcetam (KEPPRA) 1,000 mg Oral Tablet    Take 1,250 mg by mouth Twice daily        Allergies   Allergen Reactions   . Horseradish Anaphylaxis   . Biaxin [Clarithromycin] Nausea/ Vomiting   . Iv Contrast    . Oxycodone Mental Status Effect   . Peanuts [Peanut] Itching     Social History     Socioeconomic History   . Marital status: Married   Tobacco Use   . Smoking status: Never Smoker   . Smokeless tobacco: Never Used   Substance and Sexual Activity   . Alcohol use: Yes     Alcohol/week: 1.7 standard drinks     Types: 2 Cans of beer per week     Comment: occ   . Drug use: No     Past Family History:     Family Medical History:    None     Not pertinent to the presentation        ROS: Other than ROS in the HPI, all other systems were negative.    EXAM:  Temperature: 36.6 C (97.9 F)  Heart Rate: 63  BP (Non-Invasive): 126/85  Respiratory Rate: 18  SpO2: 95 %  Constitutional: appears in good health  Eyes: Conjunctiva clear.  ENT: ENMT without erythema or injection, mucous membranes moist.  Neck: no thyromegaly or lymphadenopathy and supple, symmetrical, trachea  midline  Respiratory: Clear to auscultation bilaterally.   Cardiovascular: regular rate and rhythm  Gastrointestinal: Soft, non-tender, Bowel sounds normal  Genitourinary:  Deferred  Musculoskeletal: Swelling of palm are and dorsal aspect of the left hand with an open wound in the base of the 3rd/middle finger.  There is swelling and fluctuance of the entire hand.      LABS:    I have reviewed all lab results.  Lab Results Today:    Results for orders placed or performed during the hospital encounter of 03/05/21 (from the past 24 hour(s))   CBC WITH DIFF   Result Value Ref Range    WBC 11.2 (H) 3.7 - 11.0 x10^3/uL    RBC 4.77 4.50 - 6.10 x10^6/uL    HGB 15.0 13.4 - 17.5 g/dL    HCT 91.6 94.5 - 03.8 %    MCV 91.8 78.0 - 100.0 fL    MCH 31.4 26.0 - 32.0 pg    MCHC 34.2 31.0 - 35.5 g/dL    RDW-CV 88.2 80.0 -  15.5 %    PLATELETS 257 150 - 400 x10^3/uL    MPV 11.2 8.7 - 12.5 fL    NEUTROPHIL % 81 %    LYMPHOCYTE % 11 %    MONOCYTE % 8 %    EOSINOPHIL % 0 %    BASOPHIL % 0 %    NEUTROPHIL # 9.06 (H) 1.50 - 7.70 x10^3/uL    LYMPHOCYTE # 1.19 1.00 - 4.80 x10^3/uL    MONOCYTE # 0.86 0.20 - 1.10 x10^3/uL    EOSINOPHIL # <0.10 <=0.50 x10^3/uL    BASOPHIL # <0.10 <=0.20 x10^3/uL    IMMATURE GRANULOCYTE % 0 0 - 1 %    IMMATURE GRANULOCYTE # <0.10 <0.10 x10^3/uL   ECG 12 LEAD - ADULT   Result Value Ref Range    Ventricular rate 71 BPM    Atrial Rate 71 BPM    PR Interval 140 ms    QRS Duration 98 ms    QT Interval 392 ms    QTC Calculation 425 ms    Calculated P Axis 59 degrees    Calculated R Axis 70 degrees    Calculated T Axis 37 degrees       Radiology Results:   No orders to display       Immunization History   Administered Date(s) Administered   . Covid-19 Vaccine,Pfizer-BioNTech,Purple Top,5yrs+ 10/04/2019, 10/24/2019, 11/17/2020   . Tetanus Toxoid/Diphtheria Toxoid/Acellular Pertussis Vaccine, Adsorbed (Tdap) 03/02/2021       DNR Status:  Full Code      ASSESSMENT:    Active Hospital Problems    Diagnosis   . Hand  abscess   . Juvenile myoclonic epilepsy (CMS HCC)     DVT/PE Prophylaxis: SCDs/ Venodynes/Impulse boots  Disposition Planning: Home discharge     PLAN:    1. Hand abscess/right middle finger-flexor tenosynovitis-status post irrigation and drainage today.  Continue vancomycin and cefepime, Flagyl added by ID.  Wound care orders per Dr. Celedonio Savage.  -ordered pain control at this time.  -will follow operative culture    2. 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  -reported last seizure 3 and half years ago    DVT prophylaxis-not required    Max Fickle, MD

## 2021-03-06 NOTE — Progress Notes (Addendum)
[facility]              Jorge Maddox  DOB: March 13, 1986  DOS: 03/06/2021  Time: 13:59      S:  1 day status post left middle finger debridement.  Had physical therapy with dressing change and whirlpool today.  Says he is doing better now.  O:  Afebrile vital signs stable.  Left hand bandage dry and intact now.  A/P:  Doing okay 1 day status post debridement left middle finger.  Continue as is with antibiotics and dressing changes.        Guy Begin, MD  [facility]

## 2021-03-06 NOTE — Care Plan (Signed)
PT Evaluation    The patient was referred for hydrotherapy and dressing change of the left hand.  He is POD # 1 for I & D of left 3rd finger w/flexor tenosynovectomy.  Surgeon- Dr. Celedonio Savage.  The patient sustained a pressure washer injury to the hand.  He works for an [employer].  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  He reports that his pain is currently reasonably controlled.  However, he reports 40/10 pain to touch of the left finger.  He has a history of seizures and migraine headaches.    The patient was seen in physical therapy for 102 degree F whirlpool x 15 minutes.    The dressing and 1/4 inch sterile packing were removed prior to whirlpool.  There was minimal bloody drainage about the wound sites.  There was mild erythema about the margins of the wounds.  There were four open areas: two palmer, one at the base of the 3rd finger, and another near the DIP crease.    The patient requested a ball to play with using his right hand during treatment to help distract himself and he was provided thera-putty for this right hand.  He was instructed to rest his left hand at this time.    Post whirlpool the wounds were repacked with 1/4 inch sterile gauze and re-bandaged.    The patient became nauseated and 'light-headed' during treatment.  However, he remained conscious and did not vomit.    Treatment Goal    1. Facilitate wound healing as evidenced by wound closure.    Projected Time Frame- 3 weeks    He was returned to his room and I notified his nurse that he was not feeling well post whirlpool.  We will resume whirlpool and dressing change on Monday should the patient remain hospitalized.

## 2021-03-06 NOTE — Consults (Signed)
Infectious Diseases  Initial Consult    Jorge Maddox       [id number]       Date of service: 03/06/2021  Date of Admission:  03/05/2021  Hospital Day:  LOS: 1 day   History obtained from:  Patient and medical record  Reason for consult:  Tenosynovitis  Assessment/Recommendations:   JorgeAntwoin T [name] is a 35 y.o. male with prior seizure history on Keppra admitted due to right hand tenosynovitis status post left middle finger irrigation debridement with flexor tenosynovectomy on 03/05/2021.  Operative cultures pending at this time.  Patient was taking p.o. doxycycline prior to admission.  Patient did receive Tdap vaccination the emergency department on 03/02/2021.    1.  Continue empiric coverage with IV vancomycin, pharmacy dose, IV cefepime 2 g q.12 hours.  P.o. Flagyl 500 mg q.12 hours also added.    2.  Will continue follow pending operative cultures and adjust accordingly.  Minimum 2-4 week course of antibiotics pending clinical improvement and operative cultures.  Will likely not need IV antibiotics at discharge.    3.  Continue local wound care and whirlpool per Orthopedic surgery.    HISTORY OF PRESENT ILLNESS:  Jorge Maddox is a 35 y.o. male who was admitted to [facility] 03/05/2021 due to concern of tenosynovitis of the right middle finger.  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  Patient states that he cleaned the area with soap and water and dressed it and finished out his shift.  Unfortunately as the day went on patient had difficulty moving his left hand and thus presented to the emergency department.  While in the emergency department underwent x-ray imaging without any bony abnormalities.  He was given a course of doxycycline and close follow-up with Orthopedic surgery.  He was also given a Tdap vaccination.   It was then seen in  orthopedic clinic on 03/03/2021 at which time he was recommended to soak the area and warm Epson salts and continue taking the prescribed p.o. doxycycline.  Patient states that he followed symptoms appropriately and felt that the swelling was somewhat improving though that he continued to have difficulty moving his left middle finger.  Due to this he was directly admitted on 03/05/2021 through the emergency department for surgical evaluation.  He was taken to the OR for left middle finger irrigation and debridement with flexor tenosynovectomy.  Operative cultures were obtained and are pending at this time.  He has been placed on vancomycin and cefepime.   Patient states overall today he is doing well.  He denies any fevers or chills prior to presentation.  He is awaiting whirlpool therapy.      Past medical history, surgical history, social history and family history reviewed in Epic and with the patient.    Past Medical History:   Diagnosis Date    Migraine Headaches 08/19/1997    Seizures        Surgical history:  Prior right shoulder surgery    Family Medical History:    None           Social History     Tobacco Use    Smoking status: Never Smoker    Smokeless tobacco: Never Used   Substance Use Topics    Alcohol use: Yes     Alcohol/week: 1.7  standard drinks     Types: 2 Cans of beer per week     Comment: occ    Drug use: No       Allergies   Allergen Reactions    Horseradish Anaphylaxis    Biaxin [Clarithromycin] Nausea/ Vomiting    Iv Contrast     Oxycodone Mental Status Effect    Peanuts [Peanut] Itching       Outpatient Medications Marked as Taking for the 03/05/21 encounter [facility] Encounter)   Medication Sig    doxycycline 100 mg Oral Tablet Take 1 Tablet (100 mg total) by mouth Twice daily for 7 days    HYDROcodone-acetaminophen (NORCO) 5-325 mg Oral Tablet Take 1 Tablet by mouth Every 6 hours as needed for Pain    levETIRAcetam (KEPPRA) 1,000 mg Oral Tablet Take 1,250 mg by mouth Twice daily       Patient  Active Problem List   Diagnosis    Cerebral Palsy    Migraine Headaches    Seizures    Hand abscess    Juvenile myoclonic epilepsy (CMS HCC)       REVIEW OF SYSTEMS:   Constitutional:  Denies any fevers or chills  Remainder of review of systems negative unless noted above.    PHYSICAL EXAMINATION:  Filed Vitals:    03/05/21 2237 03/06/21 0030 03/06/21 0208 03/06/21 1047   BP:  130/84  117/71   Pulse:  77  80   Resp: 18 20 18     Temp:  36.6 C (97.9 F)     SpO2:    97%         Intake/Output Summary (Last 24 hours) at 03/06/2021 1131  Last data filed at 03/05/2021 1703  Gross per 24 hour   Intake 860 ml   Output --   Net 860 ml       I have reviewed the vitals.  General: Pleasant, awake, alert, no acute distress.  Sitting up in bed, wife at bedside  HEENT: No scleral icterus or conjunctival injection. No posterior oropharynx erythema or exudate. No oral ulcer or thrush.  Trachea midline  CV: RRR without murmurs.   Respiratory: Lungs CTA bilateral, anterior and posterior lung fields without wheezing or crackles.  Breathing comfortably on room air  Abdomen: NABS, soft no tenderness to palpation all four quadrants. No palpable hepatosplenomegaly.   Extremities:  Left upper extremity and operative dressings without saturation  Neurological: CN II-XII grossly intact. AA&O x 3 without deficits. Speech clear.   Skin:  No other rashes or lesions noted  Heme:  No petechia or ecchymosis        Labs:     Results for orders placed or performed during the hospital encounter of 03/05/21 (from the past 24 hour(s))   CBC/DIFF    Narrative    The following orders were created for panel order CBC/DIFF.  Procedure                               Abnormality         Status                     ---------                               -----------         ------  CBC WITH LNLG[[id number]]                Abnormal            Final result                 Please view results for these tests on the individual orders.   CBC WITH  DIFF   Result Value Ref Range    WBC 11.2 (H) 3.7 - 11.0 x103/uL    RBC 4.77 4.50 - 6.10 x106/uL    HGB 15.0 13.4 - 17.5 g/dL    HCT 08.1 44.8 - 18.5 %    MCV 91.8 78.0 - 100.0 fL    MCH 31.4 26.0 - 32.0 pg    MCHC 34.2 31.0 - 35.5 g/dL    RDW-CV 63.1 49.7 - 02.6 %    PLATELETS 257 150 - 400 x103/uL    MPV 11.2 8.7 - 12.5 fL    NEUTROPHIL % 81 %    LYMPHOCYTE % 11 %    MONOCYTE % 8 %    EOSINOPHIL % 0 %    BASOPHIL % 0 %    NEUTROPHIL # 9.06 (H) 1.50 - 7.70 x103/uL    LYMPHOCYTE # 1.19 1.00 - 4.80 x103/uL    MONOCYTE # 0.86 0.20 - 1.10 x103/uL    EOSINOPHIL # <0.10 <=0.50 x103/uL    BASOPHIL # <0.10 <=0.20 x103/uL    IMMATURE GRANULOCYTE % 0 0 - 1 %    IMMATURE GRANULOCYTE # <0.10 <0.10 x103/uL       I have reviewed labs.      Current Facility-Administered Medications:     acetaminophen (TYLENOL) tablet, 650 mg, Oral, Q4H PRN, Vallery Ridge, MD    albuterol (PROVENTIL) 2.5 mg / 3 mL (0.083%) neb solution, 2.5 mg, Nebulization, Q6H PRN, Max Fickle, MD    aluminum-magnesium hydroxide-simethicone (MAG-AL PLUS) 200-200-20 mg per 5 mL oral liquid, 20 mL, Oral, Q4H PRN, Odis Hollingshead, Vigneshwaran, MD    cefepime (MAXIPIME) 2 g in NS 50 mL IVPB minibag, 2 g, Intravenous, Q12H, Ramanathan, Vigneshwaran, MD, Last Rate: 100 mL/hr at 03/06/21 1028, 2 g at 03/06/21 1028    diphenhydrAMINE (BENADRYL) 50 mg/mL injection, 50 mg, Intravenous, Q6H PRN, Max Fickle, MD    EPINEPHrine (ADRENALIN) 1 mg/mL injection, 0.5 mg, IntraMUSCULAR, Once PRN, Vallery Ridge, MD    levETIRAcetam (KEPPRA) tablet 1,250 mg, 1,250 mg, Oral, 2x/day, Odis Hollingshead, Vigneshwaran, MD, 1,250 mg at 03/06/21 1028    magnesium hydroxide (MILK OF MAGNESIA) 400mg  per 19mL oral liquid, 15 mL, Oral, 4x/day PRN, 4m, MD    metroNIDAZOLE (FLAGYL) tablet, 500 mg, Oral, 2x/day, Summerfield, Haley May, PA-C    morphine 4 mg/mL injection, 4 mg, Intravenous, Q3H PRN, 04[birth year]1982, MD, 4 mg at 03/06/21 1052     NS flush syringe, 3 mL, Intracatheter, Q8HRS, 03/08/21, Vigneshwaran, MD, 3 mL at 03/05/21 2138    NS flush syringe, 3 mL, Intracatheter, Q1H PRN, 2139, MD    ondansetron (ZOFRAN) 2 mg/mL injection, 4 mg, Intravenous, Q6H PRN, Max Fickle, MD    traMADol (ULTRAM) tablet, 50 mg, Oral, Q6H PRN, Max Fickle, MD, 50 mg at 03/05/21 2137    vancomycin (VANCOCIN) 1,500 mg in NS 250 mL IVPB, 15 mg/kg, Intravenous, Q12H, 2138, MD, Stopped at 03/06/21 0308    Vancomycin IV - Pharmacist to Dose per Protocol, , Does not apply, Daily PRN, 03/08/21, MD    Micro: No results found for any visits on  03/05/21 (from the past 96 hour(s)).         Rolly SalterHaley May Summerfield, [place] JerseyPA-C  03/06/2021, 11:31    Patient was not seen or examined, I was available for and reviewed the patient's case and recommendations with H. Summerfield, PA-C. I have reviewed the consult note and agree with the findings and plan of care as documented.  Any exceptions/additions have been edited/noted above.     Mayer CamelJonathan Draken Farrior, DO  - late entry

## 2021-03-06 NOTE — Care Plan (Signed)
Patient alert and oriented. Rested well throughout the night.PRN medication given for complaints of pain. Dressing clean,dry,and intact.Will continue to monitor.  BP 130/84   Pulse 77   Temp 36.6 C (97.9 F)   Resp 18   Ht 1.854 m (6\' 1" )   Wt 106 kg (233 lb 1.6 oz)   SpO2 95%   BMI 30.75 kg/m     , RN

## 2021-03-07 DIAGNOSIS — M65842 Other synovitis and tenosynovitis, left hand: Secondary | ICD-10-CM

## 2021-03-07 LAB — VANCOMYCIN PEAK: VANCOMYCIN PEAK: 23.8 ug/mL — ABNORMAL LOW (ref 30.0–40.0)

## 2021-03-07 LAB — VANCOMYCIN, TROUGH: VANCOMYCIN TROUGH: 11.3 ug/mL (ref 10.0–20.0)

## 2021-03-07 NOTE — Progress Notes (Signed)
[name]              Jorge Maddox  DOB: Sep 25, 1985  DOS: 03/07/2021  Time: 11:26      S:  2 days status post left middle finger debridement.  Nursing changed dressing change and today, went well. Pain well controlled.  O:  Picture in media of left finger wound today looks good. Afebrile vital signs stable.  Left hand bandage dry and intact now.  A/P:  Doing well 2 days status post debridement left middle finger.  Continue as is with antibiotics and dressing changes.        Guy Begin, MD  [facility]

## 2021-03-07 NOTE — Care Plan (Signed)
Patient admitted for left hand wound. I&D completed on Friday 03/05/2021. Dressing changed by nursing at bedside today, patient tolerated well. ABX given as ordered. PRN pain medication given prior to dressing change and upon request. Whirlpool therapy will resume tomorrow. Discharge plan for home.          [facility], RN 03/07/2021, 18:01

## 2021-03-07 NOTE — Progress Notes (Signed)
[facility]  Hospitalist progress note    Jorge Maddox, Jorge Maddox, 35 y.o. male  Date of Admission:  03/05/2021  Date of Birth:  07/22/1985    Information Obtained from: patient  Chief Complaint:  Left hand swelling    Subjective-day 3 IV antibiotics for left hand abscess/tenosynovitis status post I&D.  Continue daily dressing changes.    Past Medical History:   Diagnosis Date   . Migraine Headaches 08/19/1997   . Seizures          Past Surgical History was reviewed and is negative for Major surgery      Medications Prior to Admission     Prescriptions    doxycycline 100 mg Oral Tablet    Take 1 Tablet (100 mg total) by mouth Twice daily for 7 days    HYDROcodone-acetaminophen (NORCO) 5-325 mg Oral Tablet    Take 1 Tablet by mouth Every 6 hours as needed for Pain    levETIRAcetam (KEPPRA) 1,000 mg Oral Tablet    Take 1,250 mg by mouth Twice daily        Allergies   Allergen Reactions   . Horseradish Anaphylaxis   . Biaxin [Clarithromycin] Nausea/ Vomiting   . Iv Contrast    . Oxycodone Mental Status Effect   . Peanuts [Peanut] Itching     Social History     Socioeconomic History   . Marital status: Married   Tobacco Use   . Smoking status: Never Smoker   . Smokeless tobacco: Never Used   Substance and Sexual Activity   . Alcohol use: Yes     Alcohol/week: 1.7 standard drinks     Types: 2 Cans of beer per week     Comment: occ   . Drug use: No     Past Family History:     Family Medical History:    None     Not pertinent to the presentation        ROS: Other than ROS in the HPI, all other systems were negative.    EXAM:  Temperature: 37 C (98.6 F)  Heart Rate: 82  BP (Non-Invasive): 113/65  Respiratory Rate: 20  SpO2: 97 %  Constitutional: appears in good health  Eyes: Conjunctiva clear.  ENT: ENMT without erythema or injection, mucous membranes moist.  Neck: no thyromegaly or lymphadenopathy and supple, symmetrical, trachea midline  Respiratory: Clear to auscultation bilaterally.   Cardiovascular: regular rate  and rhythm  Gastrointestinal: Soft, non-tender, Bowel sounds normal  Genitourinary:  Deferred  Musculoskeletal: Swelling of palm are and dorsal aspect of the left hand with an open wound in the base of the 3rd/middle finger.  There is swelling and fluctuance of the entire hand.      LABS:    I have reviewed all lab results.  Lab Results Today:    Results for orders placed or performed during the hospital encounter of 03/05/21 (from the past 24 hour(s))   VANCOMYCIN PEAK   Result Value Ref Range    VANCOMYCIN PEAK 23.8 (L) 30.0 - 40.0 ug/mL   VANCOMYCIN, TROUGH   Result Value Ref Range    VANCOMYCIN TROUGH 11.3 10.0 - 20.0 ug/mL       Radiology Results:   No orders to display       Immunization History   Administered Date(s) Administered   . Covid-19 Vaccine,Pfizer-BioNTech,Purple Top,38yrs+ 10/04/2019, 10/24/2019, 11/17/2020   . Tetanus Toxoid/Diphtheria Toxoid/Acellular Pertussis Vaccine, Adsorbed (Tdap) 03/02/2021  DNR Status:  Full Code      ASSESSMENT:    Active Hospital Problems    Diagnosis   . Hand abscess   . Juvenile myoclonic epilepsy (CMS HCC)     DVT/PE Prophylaxis: SCDs/ Venodynes/Impulse boots  Disposition Planning: Home discharge     PLAN:    1. Hand abscess/right middle finger-flexor tenosynovitis-status post irrigation and drainage today.  Continue vancomycin and cefepime, Flagyl added by ID.  Wound care orders per Dr. Celedonio Savage.  -ordered pain control at this time.  -will follow operative culture    2. 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  -reported last seizure 3 and half years ago    DVT prophylaxis-not required    Max Fickle, MD

## 2021-03-07 NOTE — Pharmacy (Signed)
[facility] of Pharm[facility] Services  Therapeutic Drug Monitoring: Vancomycin  03/07/2021      Patient name: Jorge Maddox, Jorge Maddox  Date of Birth:  28-Jul-1985    Actual Weight:  Weight: 106 kg (233 lb 1.6 oz) (03/05/21 1715)     BMI:  BMI (Calculated): 30.82 (03/05/21 1715)      Date RPh Current regimen (including mg/kg) Indication &  Organism AUC or trough based dosing Target Levels^ SCr (mg/dL) CrCl* (mL/min) Infectious Laboratory Markers (as applicable)   Measured level(s)   (mcg/mL) Calculated AUC (if AUC based monitoring) Plan & predicted AUC/trough if initial dosing (including when levels are due) Comments   8/26 TEC Vanc 1500mg  IV Q12H SSTI AUC 400-600 0.94 142.8 WBC:  Procal:  CRP:   received Vanc 1750mg  IV x1 dose in ED @1234 ; will start Vanc 1500mg  Q12H with levels after 4 doses     08/28 RLB Vanc 1500mg  IV q12H SSTI AUC 400-600    Peak = 23.8 trough = 11.3 459 Continue current dose.                                                                                      levels depends on dosing and monitoring method, AUC vs. trough based. For AUC based dosing units are mg*h/L. For trough based dosing units are mcg/mL.     *Creatinine clearance is estimated by using the Cockcroft-Gault equation for adult patients and the for pediatric patients.    The decision to discontinue vancomycin therapy will be determined by the primary service.  Please contact the pharmacist with any questions regarding this patient's medication regimen.

## 2021-03-07 NOTE — Care Plan (Signed)
Patient alert and oriented. Complaints of pain relieved with prn medication. Slept well throughout the night. Refuses bed alarm. Ambulated independently to bathroom. Will continue to monitor.  Neldon Labella, RN  BP 113/65   Pulse 82   Temp 37 C (98.6 F)   Resp 18   Ht 1.854 m (6\' 1" )   Wt 106 kg (233 lb 1.6 oz)   SpO2 97%   BMI 30.75 kg/m

## 2021-03-08 ENCOUNTER — Ambulatory Visit (HOSPITAL_BASED_OUTPATIENT_CLINIC_OR_DEPARTMENT_OTHER): Payer: Self-pay | Admitting: Physician Assistant

## 2021-03-08 DIAGNOSIS — G40B09 Juvenile myoclonic epilepsy, not intractable, without status epilepticus: Secondary | ICD-10-CM

## 2021-03-08 LAB — STERILE SITE CULTURE AND GRAM STAIN, AEROBIC: GRAM STAIN: NONE SEEN

## 2021-03-08 LAB — ANAEROBIC CULTURE: ANAEROBIC CULTURE: NO GROWTH

## 2021-03-08 LAB — AFB CULTURE WITH STAIN: AFB SMEAR: NEGATIVE

## 2021-03-08 NOTE — Consults (Signed)
Ortho consult follow-up note    Subjective:  Patient reports tolerating dressing changes okay.  He is not sure that he and his wife can do it on their own.    Objective:  Right upper extremity  Dressing clean dry and intact.  Good capillary refill in fingertips.  Patient able to tolerate passive flexion and extension of middle finger.    Assessment:  Postoperative day 3 status post right hand irrigation and debridement.  Plan:  1. Daily dressing changes with whirlpool.  2. Antibiotics per ID.  Patient currently growing Gram-negative rods.  3. Strict elevation right hand.  4. Likely okay for discharge once susceptibilities from cultures are complete

## 2021-03-08 NOTE — Care Plan (Signed)
[facility]  Rehabilitation Services  Physical Therapy Progress Note    Patient Name: Jorge Maddox  Date of Birth: [birth year]  Height:  185.4 cm (6\' 1" )  Weight:  106 kg (233 lb 1.6 oz)  Room/Bed: 5306/A  Payor: WORKER'S COMPENSATION / Plan: OTHER WORK COMP SELF FUNDED / Product Type: Work /     Assessment:     Pt seen in PT dept for w/p to L hand. Pt had w/p for 15 min at 100 deg F. f/b packing of larges palm wound (<1cm). Wound is clean w/ minimal serous drainage. Wound packed w/ 1/4 in plain packing and covered w/ single layer xeroform gauze. Incisional wound on medial 3rd digit was not able to be packed as it has closed over. peri skin there is macerated as well as the skin on the opposite finger and web space. I did cover the open area of the incision w/ single layer xeroform - small piece. Put gauze between fingers. Wrapped w/ kling and ACE wrap. Fingers free to be moved. DIP incision completely closed over. PT will continue to follow for wound care.    Discharge Needs:   Equipment Recommendation: none     Discharge Disposition: home     JUSTIFICATION OF DISCHARGE RECOMMENDATION   Based on current diagnosis, functional performance prior to admission, and current functional performance, this patient requires continued PT services in   in order to achieve significant functional improvements in these deficit areas:  .      Plan:   Continue to follow patient according to established plan of care.  The risks/benefits of therapy have been discussed with the patient/caregiver and he/she is in agreement with the established plan of care.     Subjective & Objective:        03/08/21 03/10/21   Therapist Pager   PT Assigned/ Pager # [phone]   Rehab Session   Document Type therapy progress note (daily note)   Total PT Minutes: 45   Patient Effort excellent   Symptoms Noted During/After Treatment increased pain   General Information   Patient Profile Reviewed yes    Patient/Family/Caregiver Comments/Observations Pt states pain is only when base of finger being touched. Otherwise, pain controlled.   Physical Therapy Clinical Impression   Assessment Pt seen in PT dept for w/p to L hand. Pt had w/p for 15 min at 100 deg F. f/b packing of larges palm wound (<1cm). Wound is clean w/ minimal serous drainage. Wound packed w/ 1/4 in plain packing and covered w/ single layer xeroform gauze. Incisional wound on medial 3rd digit was not able to be packed as it has closed over. peri skin there is macerated as well as the skin on the opposite finger and web space. I did cover the open area of the incision w/ single layer xeroform - small piece. Put gauze between fingers. Wrapped w/ kling and ACE wrap. Fingers free to be moved. DIP incision completely closed over. PT will continue to follow for wound care.       Therapist:   [id number], PT   Pager #: [phone]

## 2021-03-08 NOTE — Progress Notes (Signed)
[facility]  Hospitalist Progress Note    Jorge Maddox       35 y.o.       Date of service: 03/08/2021  Date of Admission:  03/05/2021    Hospital Day:  LOS: 3 days   CC: <principal problem not specified>    Subjective:   Patient seen and examined.  Resting comfortably no acute distress when visited.  Significant other at bedside.  They do vocalized concerns with returning home with dressing changes needed and report that neither of them have the "stomach for it." Pt does endorse continued improvement without any new symptoms reported.    Objective:   Filed Vitals:    03/08/21 0114 03/08/21 0700 03/08/21 0706 03/08/21 0944   BP:  125/81     Pulse:  81     Resp: 18 18 18 18    Temp:  36.1 C (97 F)     SpO2:  96%       Oxygen Device  SpO2: 96 %  Flow (L/min): 10        I have reviewed the vitals.      Input/Output    Intake/Output Summary (Last 24 hours) at 03/08/2021 1246  Last data filed at 03/07/2021 2000  Gross per 24 hour   Intake 360 ml   Output --   Net 360 ml         acetaminophen (TYLENOL) tablet, 650 mg, Oral, Q4H PRN  albuterol (PROVENTIL) 2.5 mg / 3 mL (0.083%) neb solution, 2.5 mg, Nebulization, Q6H PRN  aluminum-magnesium hydroxide-simethicone (MAG-AL PLUS) 200-200-20 mg per 5 mL oral liquid, 20 mL, Oral, Q4H PRN  cefepime (MAXIPIME) 2 g in NS 50 mL IVPB minibag, 2 g, Intravenous, Q12H  diphenhydrAMINE (BENADRYL) 50 mg/mL injection, 50 mg, Intravenous, Q6H PRN  EPINEPHrine (ADRENALIN) 1 mg/mL injection, 0.5 mg, IntraMUSCULAR, Once PRN  levETIRAcetam (KEPPRA) tablet 1,250 mg, 1,250 mg, Oral, 2x/day  magnesium hydroxide (MILK OF MAGNESIA) 400mg  per 34mL oral liquid, 15 mL, Oral, 4x/day PRN  melatonin tablet, 9 mg, Oral, NIGHTLY  metroNIDAZOLE (FLAGYL) tablet, 500 mg, Oral, 2x/day  morphine 4 mg/mL injection, 4 mg, Intravenous, Q3H PRN  NS flush syringe, 3 mL, Intracatheter, Q8HRS  NS flush syringe, 3 mL, Intracatheter, Q1H PRN  ondansetron (ZOFRAN) 2 mg/mL injection, 4 mg, Intravenous, Q6H  PRN  traMADol (ULTRAM) tablet, 50 mg, Oral, Q6H PRN  vancomycin (VANCOCIN) 1,500 mg in NS 250 mL IVPB, 15 mg/kg, Intravenous, Q12H  Vancomycin IV - Pharmacist to Dose per Protocol, , Does not apply, Daily PRN        Physical Exam:  General:  NAD, appears stated age, normal affect  HEENT: Oropharynx is clear without lesions.  Cardiac:  RRR  Respiratory:  Grossly CTAB, nonlabored breathing on room air  Abdomen: Positive bowel sounds, soft, nontender  Extremities: No peripheral edema noted on exam.  Left upper extremity dressing in place  Neuro:  AAO x3 no new focal deficits appreciated    CBC (Last 24 Hours):  No results for input(s): WBC, HGB, HCT, MCV, PLTCNT in the last 24 hours.      BMP (Last 24 Hours):  No results for input(s): SODIUM, POTASSIUM, CHLORIDE, CO2, BUN, CREATININE, CALCIUM, GLUCOSENF in the last 24 hours.        I have reviewed all labs.    Micro:   Hospital Encounter on 03/05/21 (from the past 96 hour(s))   ANAEROBIC CULTURE    Collection Time: 03/05/21  3:17 PM  Specimen: Wound; Other   Culture Result Status    ANAEROBIC CULTURE No Growth 2 Days Preliminary   STERILE SITE CULTURE AND GRAM STAIN, AEROBIC    Collection Time: 03/05/21  3:17 PM    Specimen: Wound; Other   Culture Result Status    FLC Gram Negative Rods (A) Preliminary    GRAM STAIN No WBCs Preliminary    GRAM STAIN No Organisms Seen Preliminary       Radiology:       PT/OT: Yes    Consults:  Orthopedic surgery    Assessment/ Plan:     Hand abscess  Right middle finger flexor tenosynovitis  Status post I&D 8/26  Orthopedic surgery consult and following-greatly appreciate your help in this patient's care  Continue empiric antibiotics with vanc/cefepime/Flagyl  Wound care management per surgery primary  Continue current analgesic regimen  Follow-up cultures    Juvenile myoclonic epilepsy  Continue PTA Keppra    DVT/PE Prophylaxis: Up and ambulating    Disposition Planning: Home discharge      [employer], DO

## 2021-03-08 NOTE — Care Plan (Signed)
Patient will continue to work toward discharge goals. Pt ambulates independently in room. Dressing to left hand changed today during whirlpool session. PRN morphine given prior to dressing change/whirlpool. Pt had tramadol this evening. Pulses +2, capillary refill less than 3 seconds. Antibiotics given as ordered. Patient is alert and oriented, resting in bed at this time. Recent vitals: BP (!) 139/118   Pulse 79   Temp 36.7 C (98.1 F)   Resp 18   Ht 1.854 m (6\' 1" )   Wt 106 kg (233 lb 1.6 oz)   SpO2 96%   BMI 30.75 kg/m . Will be monitored.    , RN  03/08/2021, 19:18     Problem: Adult Inpatient Plan of Care  Goal: Plan of Care Review  Outcome: Ongoing (see interventions/notes)  Goal: Patient-Specific Goal (Individualized)  Outcome: Ongoing (see interventions/notes)  Goal: Absence of Hospital-Acquired Illness or Injury  Outcome: Ongoing (see interventions/notes)  Intervention: Identify and Manage Fall Risk  Recent Flowsheet Documentation  Taken 03/08/2021 1607 by 03/10/2021, RN  Safety Promotion/Fall Prevention: safety round/check completed  Taken 03/08/2021 1155 by 03/10/2021, RN  Safety Promotion/Fall Prevention: safety round/check completed  Taken 03/08/2021 0844 by 03/10/2021, RN  Safety Promotion/Fall Prevention: safety round/check completed  Intervention: Prevent Skin Injury  Recent Flowsheet Documentation  Taken 03/08/2021 0844 by 03/10/2021, RN  Body Position:  . supine, head elevated  . positioned/repositioned independently  Intervention: Prevent and Manage VTE (Venous Thromboembolism) Risk  Recent Flowsheet Documentation  Taken 03/08/2021 1607 by 03/10/2021, RN  VTE Prevention/Management: ambulation promoted  Taken 03/08/2021 1155 by 03/10/2021, RN  VTE Prevention/Management: ambulation promoted  Goal: Optimal Comfort and Wellbeing  Outcome: Ongoing (see interventions/notes)  Intervention: Provide Person-Centered Care  Recent Flowsheet Documentation  Taken 03/08/2021 0844 by 03/10/2021, RN  Trust Relationship/Rapport:  . care explained  . choices provided  . questions encouraged  . questions answered  . reassurance provided  . thoughts/feelings acknowledged  . empathic listening provided  Goal: Rounds/Family Conference  Outcome: Ongoing (see interventions/notes)     Problem: Fall Injury Risk  Goal: Absence of Fall and Fall-Related Injury  Outcome: Ongoing (see interventions/notes)  Intervention: Promote Injury-Free Environment  Recent Flowsheet Documentation  Taken 03/08/2021 1607 by 03/10/2021, RN  Safety Promotion/Fall Prevention: safety round/check completed  Taken 03/08/2021 1155 by 03/10/2021, RN  Safety Promotion/Fall Prevention: safety round/check completed  Taken 03/08/2021 0844 by 03/10/2021, RN  Safety Promotion/Fall Prevention: safety round/check completed     Problem: Bleeding (Surgery Nonspecified)  Goal: Absence of Bleeding  Outcome: Ongoing (see interventions/notes)     Problem: Bowel Motility Impaired (Surgery Nonspecified)  Goal: Effective Bowel Elimination  Outcome: Ongoing (see interventions/notes)  Intervention: Enhance Bowel Motility and Elimination  Recent Flowsheet Documentation  Taken 03/08/2021 0844 by 03/10/2021, RN  Bowel Motility Enhancement:  . ambulation promoted  . fluid intake encouraged  . oral intake encouraged     Problem: Infection (Surgery Nonspecified)  Goal: Absence of Infection Signs and Symptoms  Outcome: Ongoing (see interventions/notes)     Problem: Ongoing Anesthesia Effects (Surgery Nonspecified)  Goal: Anesthesia/Sedation Recovery  Outcome: Ongoing (see interventions/notes)  Intervention: Optimize Anesthesia Recovery  Recent Flowsheet Documentation  Taken 03/08/2021 1607 by 03/10/2021, RN  Safety Promotion/Fall Prevention: safety round/check completed  Taken 03/08/2021 1155 by 03/10/2021, RN  Safety Promotion/Fall Prevention: safety round/check completed  Taken 03/08/2021 0844 by 03/10/2021, RN  Safety Promotion/Fall Prevention: safety  round/check completed  Problem: Pain (Surgery Nonspecified)  Goal: Acceptable Pain Control  Outcome: Ongoing (see interventions/notes)     Problem: Postoperative Nausea and Vomiting (Surgery Nonspecified)  Goal: Nausea and Vomiting Relief  Outcome: Ongoing (see interventions/notes)     Problem: Postoperative Urinary Retention (Surgery Nonspecified)  Goal: Effective Urinary Elimination  Outcome: Ongoing (see interventions/notes)     Problem: Seizure Disorder Comorbidity  Goal: Maintenance of Seizure Control  Outcome: Ongoing (see interventions/notes)

## 2021-03-09 ENCOUNTER — Encounter (HOSPITAL_BASED_OUTPATIENT_CLINIC_OR_DEPARTMENT_OTHER): Payer: Self-pay | Admitting: Physician Assistant

## 2021-03-09 DIAGNOSIS — Z23 Encounter for immunization: Secondary | ICD-10-CM

## 2021-03-09 DIAGNOSIS — B965 Pseudomonas (aeruginosa) (mallei) (pseudomallei) as the cause of diseases classified elsewhere: Secondary | ICD-10-CM

## 2021-03-09 LAB — SURGICAL PATHOLOGY SPECIMEN

## 2021-03-09 LAB — BASIC METABOLIC PANEL
ANION GAP: 7 mmol/L
BUN/CREA RATIO: 16
BUN: 15 mg/dL (ref 10–25)
CALCIUM: 9.8 mg/dL (ref 8.8–10.3)
CHLORIDE: 102 mmol/L (ref 98–111)
CO2 TOTAL: 30 mmol/L (ref 21–35)
CREATININE: 0.96 mg/dL (ref <=1.30)
ESTIMATED GFR: >60 mL/min/{1.73_m2}
GLUCOSE: 101 mg/dL (ref 70–110)
POTASSIUM: 4.5 mmol/L (ref 3.5–5.0)
SODIUM: 139 mmol/L (ref 135–145)

## 2021-03-09 MED ORDER — SULFAMETHOXAZOLE 800 MG-TRIMETHOPRIM 160 MG TABLET
1.0000 | ORAL_TABLET | Freq: Two times a day (BID) | ORAL | Status: DC
Start: 2021-03-09 — End: 2021-03-09
  Administered 2021-03-09: 160 mg via ORAL
  Filled 2021-03-09: qty 1

## 2021-03-09 MED ORDER — SULFAMETHOXAZOLE 800 MG-TRIMETHOPRIM 160 MG TABLET
1.0000 | ORAL_TABLET | Freq: Two times a day (BID) | ORAL | 0 refills | Status: AC
Start: 2021-03-09 — End: 2021-03-23

## 2021-03-09 NOTE — Consults (Signed)
Infectious Diseases  Follow up note          Jorge Maddox       [id number]       Date of service: 03/09/2021  Date of Admission:  03/05/2021    Hospital Day:  LOS: 4 days     Assessment/Recommendations:   Mr.Jorge Maddox is a 35 y.o. male with prior seizure history on Keppra admitted due to right hand tenosynovitis status post left middle finger irrigation debridement with flexor tenosynovectomy on 03/05/2021. Patient was taking p.o. doxycycline prior to admission. Patient did receive Tdap vaccination the emergency department on 03/02/2021.    Operative cultures positive for Pseudomonas stutzeri.  Cefepime sensitivity not reported, likely indicating intermediate sensitivity.  Would like to avoid quinolones due to increased risk of tendon rupture.     1.             antimicrobials deescalated to p.o. Bactrim 1 double strength b.i.d..  Would recommend 2 week course with close outpatient follow-up.     2.             continue local wound care per Orthopedic surgery.    Subjective:  Patient seen this morning sitting up in bed resting comfortably.  Patient states overall he is feeling well.  Discussed with him operative culture findings and plan for transition to p.o..  Patient agreeable.      Review of systems:  Remainder of review of systems negative unless indicated above.     Objective:   Filed Vitals:    03/08/21 2000 03/08/21 2229 03/09/21 0020 03/09/21 0700   BP: 127/84  121/75 119/80   Pulse: 62  70 81   Resp: 18 18 18 18    Temp: 36.6 C (97.9 F)  36.7 C (98.1 F) 36.5 C (97.7 F)   SpO2: 98%  96% 95%         Intake/Output Summary (Last 24 hours) at 03/09/2021 1045  Last data filed at 03/08/2021 2000  Gross per 24 hour   Intake 3 ml   Output --   Net 3 ml       I have reviewed the vitals.    General: Pleasant, awake, alert, no acute distress.  Sitting up in bed eating breakfast  CV: RRR   Respiratory:  Breathing comfortably on room air, no tachypnea  Extremities:  Left hand operative dressings without  saturation  Neurological:  Awake alert and oriented      Labs:   BMP (Last 24 Hours):    Recent Results last 24 hours     03/09/21  0615   SODIUM 139   POTASSIUM 4.5   CHLORIDE 102   CO2 30   BUN 15   CREATININE 0.96   CALCIUM 9.8   GLUCOSENF 101         I have reviewed labs.        Current Facility-Administered Medications:     acetaminophen (TYLENOL) tablet, 650 mg, Oral, Q4H PRN, 03/11/21, MD, 650 mg at 03/09/21 0924    albuterol (PROVENTIL) 2.5 mg / 3 mL (0.083%) neb solution, 2.5 mg, Nebulization, Q6H PRN, 03/11/21, MD    aluminum-magnesium hydroxide-simethicone (MAG-AL PLUS) 200-200-20 mg per 5 mL oral liquid, 20 mL, Oral, Q4H PRN, Max Fickle, MD    diphenhydrAMINE (BENADRYL) 50 mg/mL injection, 50 mg, Intravenous, Q6H PRN, Max Fickle, Vigneshwaran, MD    EPINEPHrine (ADRENALIN) 1 mg/mL injection, 0.5 mg, IntraMUSCULAR, Once PRN, Odis Hollingshead, MD  levETIRAcetam (KEPPRA) tablet 1,250 mg, 1,250 mg, Oral, 2x/day, Odis Hollingshead, Vigneshwaran, MD, 1,250 mg at 03/09/21 9432    magnesium hydroxide (MILK OF MAGNESIA) 400mg  per 57mL oral liquid, 15 mL, Oral, 4x/day PRN, 4m, MD    melatonin tablet, 9 mg, Oral, NIGHTLY, Max Fickle, MD, 9 mg at 03/08/21 2130    morphine 4 mg/mL injection, 4 mg, Intravenous, Q3H PRN, 2131, MD, 4 mg at 03/08/21 0844    NS flush syringe, 3 mL, Intracatheter, Q8HRS, 03/10/21, MD, 3 mL at 03/08/21 2130    NS flush syringe, 3 mL, Intracatheter, Q1H PRN, 2131, MD    ondansetron (ZOFRAN) 2 mg/mL injection, 4 mg, Intravenous, Q6H PRN, Max Fickle, MD    traMADol (ULTRAM) tablet, 50 mg, Oral, Q6H PRN, Max Fickle, MD, 50 mg at 03/09/21 03/11/21    trimethoprim-sulfamethoxazole (BACTRIM DS) 160-800mg  per tablet, 1 Tablet, Oral, 2x/day, 0037 May, PA-C, 160 mg at 03/09/21 03/11/21    Micro:   Hospital Encounter on 03/05/21 (from the past 96 hour(s))   ANAEROBIC CULTURE     Collection Time: 03/05/21  3:17 PM    Specimen: Wound; Other   Culture Result Status    ANAEROBIC CULTURE No Growth 3 Days Final   AFB CULTURE WITH STAIN    Collection Time: 03/05/21  3:17 PM    Specimen: Wound; Other   Culture Result Status    AFB SMEAR Auramine concentrated smear:  Negative Preliminary   STERILE SITE CULTURE AND GRAM STAIN, AEROBIC    Collection Time: 03/05/21  3:17 PM    Specimen: Wound; Other   Culture Result Status    FLC Pseudomonas stutzeri (A) Final    GRAM STAIN No WBCs Final    GRAM STAIN No Organisms Seen Final       Susceptibility    Pseudomonas stutzeri -  (no method available)     Ceftazidime <=1 Sensitive      Ciprofloxacin <=0.25 Sensitive      Gentamicin <=1 Sensitive      Imipenem <=0.25 Sensitive      Levofloxacin <=0.12 Sensitive      Piperacillin/Tazobactam <=4 Sensitive      Tobramycin <=1 Sensitive      Trimethoprim/Sulfamethoxazole <=20 Sensitive      Amikacin <=2 Sensitive      Cefotaxime 0.5 Sensitive      Tetracycline <=1 Sensitive             Haley May Summerfield, PA-C  03/09/2021, 10:45    Patient was not seen or examined today, I was available for and reviewed the patient's case and recommendations with H. Summerfield, PA-C. I have reviewed the consult note and agree with the findings and plan of care as documented.  Any exceptions/additions have been edited/noted above.     03/11/2021, DO  03/09/2021, 15:36

## 2021-03-09 NOTE — OR Surgeon (Signed)
PATIENT NAME: Jorge Maddox  HOSPITAL NUMBER:  [id number]  DATE OF SERVICE: 03/05/2021  DATE OF BIRTH:  [birth year]    OPERATIVE REPORT    PREOPERATIVE DIAGNOSIS:  Left middle finger flexor tenosynovitis.    POSTOPERATIVE DIAGNOSIS:  Left middle finger flexor tenosynovitis.    NAME OF PROCEDURE:  Left middle finger irrigation and debridement with flexor tenosynovectomy.    SURGEON:  Anselm Pancoast. Tamecca Artiga, MD.    ANESTHESIA:  General.    ESTIMATED BLOOD LOSS:  2 mL.    COMPLICATIONS:  None apparent.    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  We placed him on antibiotics after his injury.  We saw him the day after his injury.  He did have some swelling and pain, as well as some neurapraxia type symptoms.  I recommend he come back in 2 days to recheck his hand.  He came back in today with signs of flexor tenosynovitis with swelling and pain along the left middle finger.  I recommended a trip to the operating room for urgent irrigation and debridement of his hand to get rid of his infection.  We discussed what this would entail.  We discussed the postoperative recovery.  We discussed the risks of surgery in detail, which include but are not limited to, bleeding, infection, damage to nerves and blood vessels, pain, stiffness, need for additional surgeries, need for wound packing, as well as the risks of anesthesia.  Knowing these risks, the patient elected to proceed.    DESCRIPTION OF PROCEDURE:  The patient was identified in the preoperative holding area.  The correct extremity was marked with an indelible marker using the preoperative consent for verification.  The patient was taken back to the operative theater and placed supine on the operating room table.  General anesthesia was induced without incident. Appropriate antibiotics were administered.  The left upper extremity was prepped and  draped in the usual sterile fashion.     A preincision time-out was carried out verifying correct patient, side, site, type of procedure, and equipment.  I began by gravity exsanguinating the left upper extremity.  Tourniquet was inflated to 250 mmHg.  I began by opening up the area of the pressure washer injury site.  There was return of cloudy fluid.  I sent this fluid for aerobic, anaerobic, fungal, and AFB cultures.  I then made an incision in a [place] style fashion over the proximal phalanx of the middle finger.  I made a transverse incision in the DIP joint flexion crease.  I then exposed the A1 pulley proximally.  I performed an excisional debridement of skin, subcutaneous tissue, and fascia over a 2 x 2 cm area using curettes and rongeurs as well as tenotomy scissors.  I exposed the A1 pulley.  I divided it.  I then inserted a 14-gauge angiocatheter into the flexor tendon sheath.  I then placed Ragnell retractors distally at my DIP joint flexion crease incision and opened the flexor sheath distally.  I then used a 20 mL syringe and antibiotic saline to irrigate out the flexor sheath.  There was initially a small amount of cloudy fluid return followed by then clear fluid.  I put around 100 mL of fluid through the sheath with good clear fluid return at the conclusion of my irrigation.     At this point, I performed a flexor tenosynovectomy.  I sent inflamed tenosynovium around the flexor tendons for aerobic, anaerobic, fungal, and AFB culture, as well as pathologic exam.  I milked the flexor tendons from proximal to the wrist over the carpal tunnel.  I did not see any return of cloudy fluid proximally.  I copiously irrigated the palm wound and open wound into the finger with antibiotic irrigation.  I then partially closed the wounds with interrupted 4-0 chromic suture.  I packed 2 wounds in the palm open as well as a wound on the medial aspect of the proximal phalanx of the middle finger and the DIP joint  crease open with quarter-inch plain packing.  I injected 0.5% Sensorcaine for postoperative analgesia.  I dressed the wounds with Xeroform, 4 x 4's, ABD, cast padding, and Ace.     All sponge and needle counts were correct at the end of the case.     I was present for all portions of the procedure.        Dicie Beam, MD  [facility]              DD:  03/09/2021 08:52:03  DT:  03/09/2021 09:51:29 KLN  D#:  [id number]

## 2021-03-09 NOTE — Care Plan (Signed)
[facility]  Rehabilitation Services  Physical Therapy Progress Note    Patient Name: Jorge Maddox  Date of Birth: 1985-10-18  Height:  185.4 cm (6\' 1" )  Weight:  106 kg (233 lb 1.6 oz)  Room/Bed: 5306/A  Payor: WORKER'S COMPENSATION / Plan: OTHER WORK COMP SELF FUNDED / Product Type: Work /     Assessment:     Pt seen in PT dept for wound care L hand. Dressing removed. No maceration in webspace of fingers today. Pt had w/p to L hand for 12 minutes at 100 deg F. Hand wiped w/ sterile towel and dry, peeling skin removed from fingers. Pt instructed to use moisturizer on uninvolved fingers for dryness. Palmar wound paced w/ 1/4 inch plain packing. Wound bed is clean. Minimal serous drainage. Covered w/ single layer xeroform over incision and open areas. Pt instructed in dressing change/packing for home. Given supplies. Gauze b/t 3rd and 4th fingers and over wound. Wrapped w/ kling and ACE. Pt able to move fingers freely.    Discharge Needs:   Equipment Recommendation: none      Discharge Disposition: home      JUSTIFICATION OF DISCHARGE RECOMMENDATION   Based on current diagnosis, functional performance prior to admission, and current functional performance, this patient requires continued PT services in   in order to achieve significant functional improvements in these deficit areas:  .      Plan:   Continue to follow patient according to established plan of care.  The risks/benefits of therapy have been discussed with the patient/caregiver and he/she is in agreement with the established plan of care.     Subjective & Objective:      03/09/21 03/11/21   Therapist Pager   PT Assigned/ Pager # [phone]   Rehab Session   Document Type therapy progress note (daily note)   Total PT Minutes: 45   Patient Effort excellent   General Information   Patient Profile Reviewed yes   Patient/Family/Caregiver Comments/Observations Pt only having pain w/ direct touch to wounds or base of middle  finger.   Physical Therapy Clinical Impression   Assessment Pt seen in PT dept for wound care L hand. Dressing removed. No maceration in webspace of fingers today. Pt had w/p to L hand for 12 minutes at 100 deg F. Hand wiped w/ sterile towel and dry, peeling skin removed from fingers. Pt instructed to use moisturizer on uninvolved fingers for dryness. Palmar wound paced w/ 1/4 inch plain packing. Wound bed is clean. Minimal serous drainage. Covered w/ single layer xeroform over incision and open areas. Pt instructed in dressing change/packing for home. Given supplies. Gauze b/t 3rd and 4th fingers and over wound. Wrapped w/ kling and ACE. Pt able to move fingers freely.       Therapist:   [id number], PT   Pager #: [phone]

## 2021-03-09 NOTE — Nurses Notes (Signed)
Patient discharged home with family.  AVS reviewed with patient/care giver.  A written copy of the AVS and discharge instructions was given to the patient/care giver.  Questions sufficiently answered as needed.  Patient/care giver encouraged to follow up with PCP as indicated.  In the event of an emergency, patient/care giver instructed to call 911 or go to the nearest emergency room.     Julius Bowels, RN  03/09/2021, 14:21

## 2021-03-09 NOTE — Discharge Summary (Signed)
[facility]  DISCHARGE SUMMARY      PATIENT NAME:  Jorge Maddox, Jorge Maddox  MRN:  [id number]  DOB:  02-23-1986      ENCOUNTER START DATE: 03/05/2021  INPATIENT ADMISSION DATE: 03/05/2021    DISCHARGE DATE:  03/09/2021    ATTENDING PHYSICIAN: Paris Lore DO  PRIMARY CARE PHYSICIAN: Dennis Bast, MD     ADMISSION DIAGNOSIS: <principal problem not specified>  Chief Complaint   Patient presents with   . Hand Pain     Is scheduled for hand surgery by Dr. Celedonio Savage tomorrow and sent here for admission work up. Pt is on abx.        DISCHARGE DIAGNOSIS:   Hospital Problems) (* Primary Problem)    Diagnosis Date Noted   . Hand abscess 03/05/2021   . 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     Active Non-Hospital Problems    Diagnosis Date Noted   . Seizures 02/14/2003   . Migraine Headaches 08/19/1997   . Cerebral Palsy 12/14/1992        DISCHARGE MEDICATIONS:     Current Discharge Medication List      START taking these medications.      Details   sulfamethoxazole-trimethoprim 800-160 mg tablet  Commonly known as: BACTRIM DS   160 mg, Oral, 2 TIMES DAILY  Qty: 27 Tablet  Refills: 0        CONTINUE these medications - NO CHANGES were made during your visit.      Details   HYDROcodone-acetaminophen 5-325 mg Tablet  Commonly known as: NORCO   1 Tablet, Oral, EVERY 6 HOURS PRN  Qty: 12 Tablet  Refills: 0     levETIRAcetam 1,000 mg Tablet  Commonly known as: KEPPRA   1,250 mg, Oral, 2 TIMES DAILY  Refills: 0        STOP taking these medications.    doxycycline 100 mg Tablet            DISCHARGE INSTRUCTIONS:   No discharge procedures on file.   Follow-up Information     Dahl, Anselm Pancoast, MD On 03/12/2021.    Specialties: ORTHOPAEDIC SURGERY, SURGERY OF THE HAND  Why: As needed, post-hospitalization follow up  Contact information:  [address]  [address]  [place] [address]  [phone]                         REASON FOR  HOSPITALIZATION:  Per Dr Emelda Brothers H&P 03/05/2021:   "AVIGDOR DOLLAR III is a 35 y.o., White male who presents with left hand swelling after he injured his left hand using industrial power wash several days prior.  Seen in the ED and discharged with oral antibiotics to see the hand surgeon.  He was then referred to come through the ER for irrigation and debridement.  Patient denies fevers or chills.  He has been taking antibiotics.  Pain and swelling of the left hand has been increasing.  He has no prior history of MRSA infections.  He works in the [employer], community."    HOSPITAL COURSE:     35 year old white male admitted for right middle finger flexor tenosynovitis with abscess which progressed after injuring the hand using a pressure washer at work.  Orthopedic surgery was consulted and patient underwent incision and drainage 03/05/2021.  Patient was started on empiric antibiotics and  after intraoperative cultures resulted was transition to Bactrim to complete 2 week course as an outpatient.  Patient demonstrated daily improvement after I&D was discharged in stable and improved condition with outpatient follow-up with Infectious Disease as well as Orthopedic surgery.  Discussed discharge plans with patient and significant other at bedside vocalized understanding with all questions answered.    SIGNIFICANT PHYSICAL FINDINGS:   General:  NAD, appears stated age, normal affect  HEENT: Oropharynx is clear without lesions.  Cardiac:  RRR  Respiratory:  Grossly CTAB, nonlabored breathing on room air  Abdomen: Positive bowel sounds, soft, nontender  Extremities: No peripheral edema noted on exam.  Left upper extremity dressing in place  Neuro:  AAO x3 no new focal deficits appreciated    CONSULTATIONS:  Infectious Disease, Orthopedic surgery      PROCEDURES PERFORMED:   Procedure: Left middle finger irrigation and debridement with flexor tenosynovectomy  Surgeon: Thelma Barge, MD  Date: 03/05/2021    DOES PATIENT  HAVE ADVANCED DIRECTIVES:  No, Information Offered and Given    ADVANCED CARE PLANNING - Not applicable for this patient    CONDITION ON DISCHARGE: Alert, Oriented and VS Stable    DISCHARGE DISPOSITION:  Home discharge     Copies sent to Care Team       Relationship Specialty Notifications Start End    Dennis Bast, MD PCP - General INTERNAL MEDICINE  02/17/21     Phone: [phone] Fax: [phone]         [address] [facility] [address]            Paris Lore, DO

## 2021-03-09 NOTE — Care Plan (Signed)
Mr. Guercio is alert and oriented x4. Ambulates independenty. Dressing on left hand is clean, dry and intact. Pt. Can wiggle fingers on left hand. Numbness present in fingertips of left hand. PRN medication given for pain. Pt. Slept in between nursing care without issue.     BP 121/75   Pulse 70   Temp 36.7 C (98.1 F)   Resp 18   Ht 1.854 m (6\' 1" )   Wt 106 kg (233 lb 1.6 oz)   SpO2 96%   BMI 30.75 kg/m     , LPN  Herbert Moors, 07:12       Problem: Adult Inpatient Plan of Care  Goal: Plan of Care Review  Outcome: Ongoing (see interventions/notes)  Flowsheets (Taken 03/08/2021 2000)  Plan of Care Reviewed With: patient  Goal: Patient-Specific Goal (Individualized)  Outcome: Ongoing (see interventions/notes)  Goal: Absence of Hospital-Acquired Illness or Injury  Outcome: Ongoing (see interventions/notes)  Intervention: Identify and Manage Fall Risk  Recent Flowsheet Documentation  Taken 03/09/2021 0000 by 03/11/2021, LPN  Safety Promotion/Fall Prevention: safety round/check completed  Taken 03/08/2021 2000 by 03/10/2021, LPN  Safety Promotion/Fall Prevention:  . safety round/check completed  . nonskid shoes/slippers when out of bed  Intervention: Prevent Skin Injury  Recent Flowsheet Documentation  Taken 03/08/2021 2000 by 03/10/2021, LPN  Body Position: positioned/repositioned independently  Intervention: Prevent and Manage VTE (Venous Thromboembolism) Risk  Recent Flowsheet Documentation  Taken 03/09/2021 0000 by 03/11/2021, LPN  VTE Prevention/Management:  . ambulation promoted  . dorsiflexion/plantar flexion performed  Taken 03/08/2021 2000 by 03/10/2021, LPN  VTE Prevention/Management:  . ambulation promoted  . dorsiflexion/plantar flexion performed  Goal: Optimal Comfort and Wellbeing  Outcome: Ongoing (see interventions/notes)  Intervention: Provide Person-Centered Care  Recent Flowsheet Documentation  Taken 03/08/2021 2000 by 03/10/2021, LPN  Trust  Relationship/Rapport:  . care explained  . choices provided  . emotional support provided  . empathic listening provided  . questions answered  . reassurance provided  . questions encouraged  . thoughts/feelings acknowledged  Goal: Rounds/Family Conference  Outcome: Ongoing (see interventions/notes)

## 2021-03-10 ENCOUNTER — Ambulatory Visit: Payer: Self-pay

## 2021-03-10 ENCOUNTER — Telehealth (HOSPITAL_BASED_OUTPATIENT_CLINIC_OR_DEPARTMENT_OTHER): Payer: Self-pay | Admitting: Physician Assistant

## 2021-03-10 ENCOUNTER — Other Ambulatory Visit: Payer: Self-pay

## 2021-03-10 DIAGNOSIS — Z9889 Other specified postprocedural states: Secondary | ICD-10-CM

## 2021-03-10 DIAGNOSIS — L089 Local infection of the skin and subcutaneous tissue, unspecified: Secondary | ICD-10-CM

## 2021-03-10 NOTE — Telephone Encounter (Signed)
Patient is requesting home health for wound care

## 2021-03-11 ENCOUNTER — Non-Acute Institutional Stay: Payer: Self-pay

## 2021-03-11 ENCOUNTER — Other Ambulatory Visit: Payer: Self-pay

## 2021-03-11 NOTE — Progress Notes (Signed)
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 He is status post left hand irrigation and debridement on 03/05/21.He was discharged from the hospital on 03/05/21 and has not been able to change his own packing at home. He presents today for a dressing change.              Plan:   1)His wound was redressed prior to him leaving clinic today. An order for home health was placed today. I advised him to call the office in the afternoon if he has not heard from home health and we can look into his order.    2) He will follow up with Kallie Edward PA-C on 03/12/21 for a wound check      Terrez Ander, MA 03/10/21,10:54

## 2021-03-11 NOTE — Case Communication (Signed)
HH SOC for SN.  Start on 03/11/2021.    SN for medical and symptom management plus -Daily wound packing -left hand palm -below middle finger -1/4" inch packing in opening, then strip zerform, gauze, and coband.     Jorge Maddox    Patient will have financial responsibility/ Worker's comp has agreed to pay 100% for daily dressing changes by Home health.    MD Referral from [facility] - Kallie Edward, PA-C.    Allergies: Horseradish, Biaxin [C larithromycin], Iv Contrast, Oxycodone, Peanuts [Peanut].    History: Cerebral palsy, seizures.    Patient has been  vaccinated for COVID.    Lives with his wife.    PCP Vergia Alberts, MD    [facility]AT    Medication List at Discharge      hydrocodone/acetaminophen 5-325 mg 1 Tablet Oral EVERY 6 HOURS PRN    levetiracetam 1,250 mg Oral 2 TIMES DAILY    sulfamethoxazole/trimethoprim 800-160 mg 160 mg Oral 2 TIMES DAILY

## 2021-03-11 NOTE — Nursing Note (Signed)
Services currently provided to patient:       Case Conference Notes:  Initial case conference scheduled    Summary of Care:       Priority Issues:       Discipline Notes:

## 2021-03-12 ENCOUNTER — Other Ambulatory Visit: Payer: Self-pay

## 2021-03-12 ENCOUNTER — Encounter (HOSPITAL_BASED_OUTPATIENT_CLINIC_OR_DEPARTMENT_OTHER): Payer: Self-pay | Admitting: Physician Assistant

## 2021-03-12 ENCOUNTER — Ambulatory Visit: Payer: Worker's Compensation | Attending: Physician Assistant | Admitting: Physician Assistant

## 2021-03-12 DIAGNOSIS — T709XXA Effect of air pressure and water pressure, unspecified, initial encounter: Secondary | ICD-10-CM | POA: Insufficient documentation

## 2021-03-12 DIAGNOSIS — T709XXD Effect of air pressure and water pressure, unspecified, subsequent encounter: Secondary | ICD-10-CM

## 2021-03-12 DIAGNOSIS — Z9889 Other specified postprocedural states: Secondary | ICD-10-CM | POA: Insufficient documentation

## 2021-03-12 DIAGNOSIS — S6982XD Other specified injuries of left wrist, hand and finger(s), subsequent encounter: Secondary | ICD-10-CM

## 2021-03-12 DIAGNOSIS — Z5189 Encounter for other specified aftercare: Secondary | ICD-10-CM | POA: Insufficient documentation

## 2021-03-12 DIAGNOSIS — S6982XA Other specified injuries of left wrist, hand and finger(s), initial encounter: Secondary | ICD-10-CM | POA: Insufficient documentation

## 2021-03-12 NOTE — Progress Notes (Signed)
[facility]  [address]  [place] [address]  [phone]      Date: 03/12/2021  Name: Jorge Maddox  Age: 35 y.o.  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 He is status post left middle finger I & D with flexor tenosynovectomy on 03/05/2021. He is now approximately 1 week out from the above said procedure. He reports improvement in his pain following surgery. Patient rates his pain today as a 1 out of 10. It is an intermittent, needle-like pain. Pain is increased with use. Pain is decreased with rest. He takes Ibuprofen as needed for pain management. Patient reports numbness and tingling in the left middle finger. He is not a diabetic and denies any tobacco use. He is here today for a wound check and for further evaluation.    Past Medical History:     Past Medical History:   Diagnosis Date    Migraine Headaches 08/19/1997    Seizures              Family Medical History:    None         Social History     Tobacco Use    Smoking status: Never Smoker    Smokeless tobacco: Never Used   Substance Use Topics    Alcohol use: Yes     Alcohol/week: 1.7 standard drinks     Types: 2 Cans of beer per week     Comment: occ     Current Outpatient Medications   Medication Sig    HYDROcodone-acetaminophen (NORCO) 5-325 mg Oral Tablet Take 1 Tablet by mouth Every 6 hours as needed for Pain    levETIRAcetam (KEPPRA) 1,000 mg Oral Tablet Take 1,250 mg by mouth Twice daily    trimethoprim-sulfamethoxazole (BACTRIM DS) 160-800mg  per tablet Take 1 Tablet (160 mg total) by mouth Twice daily for 27 doses     Allergies   Allergen Reactions    Horseradish Anaphylaxis    Biaxin [Clarithromycin] Nausea/ Vomiting    Iv Contrast     Oxycodone Mental Status Effect    Peanuts [Peanut] Itching       Review of Systems:      As per HPI otherwise Review of Systems is Negative.      Physical Examination:     There were no vitals taken for this visit.        GENERAL: Patient is in no acute distress. Awake, alert and oriented x 3. Mood is appropriate.  HEENT: Head is normocephalic, atraumatic. Extraocular movements are intact  NECK: Demonstrates functional range of motion. Trachea is midline.  CHEST: Demonstrates full expansion with inspiration.  HEART: Regular rate and rhythm.  MUSCULOSKELETAL: Left wrist and hand: Sutures are intact. Surgical site is healing well. No signs of infection. No erythema, drainage, or increased warmth. No palpable masses. No lymphadenopathy. Mild tenderness noted over the surgical site. He has limited flexion of the middle finger. He is able to move the remaining digits. Functional range of motion of the wrist. Radial, ulnar, and median nerve function is intact. Radial pulses 2+. Sensation is intact throughout the hand. Capillary refill is less than 2 seconds.         Data Reviewed:   None  Assessment:       ICD-10-CM    1. Status post surgery  Z98.890    2. Visit for wound check  Z51.89    3. Traumatic effect of water pressure  T70.9XXA    4. High pressure injury of left hand  S69.82XA        Plan:   1) I discussed my evaluation and treatment plan with [name].     2) At this time, he is progressing well. His dressing was changed today which was well-tolerated. We discussed proper wound care with daily packing and dressing changes. He was advised on the importance of motion. He was advised to work on passive motion of the digits while at home. We plan to see him back in one week for suture removal and for re-evaluation.    3) He will return in one week for suture removal and for re-evaluation with Dr. Celedonio Savage. He is to call or return sooner if he develops any new or concerning symptoms. All questions were answered to his satisfaction. He does understand and agrees with the treatment plan. Patient  will follow up as directed.       I am scribing for, and in the presence of, Kallie Edward, PA-C for services provided on 03/12/2021.  Ciro Backer, MA, SCRIBE      [place], [place] 03/12/2021, 08:55       I personally performed the services described in this documentation, as scribed  in my presence, and it is both accurate  and complete.    Kallie Edward, PA-C    I have reviewed the note and made changes where needed.  I agree with the plan as above.    Thelma Barge, MD

## 2021-03-16 ENCOUNTER — Telehealth (HOSPITAL_BASED_OUTPATIENT_CLINIC_OR_DEPARTMENT_OTHER): Payer: Self-pay | Admitting: Physician Assistant

## 2021-03-16 ENCOUNTER — Encounter (HOSPITAL_BASED_OUTPATIENT_CLINIC_OR_DEPARTMENT_OTHER): Payer: Self-pay | Admitting: Physician Assistant

## 2021-03-16 NOTE — Telephone Encounter (Addendum)
Called to let the patient know that the letter has been faxed.     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     PLEASE CALL WHEN AVAILABLE     THANKS   STEPHANIE

## 2021-03-18 ENCOUNTER — Telehealth (HOSPITAL_BASED_OUTPATIENT_CLINIC_OR_DEPARTMENT_OTHER): Payer: Self-pay | Admitting: Orthopaedic Surgery

## 2021-03-18 NOTE — Telephone Encounter (Signed)
Patient called in states he is still having some swelling in his hand, he states "some other parts of my hand the swelling has went down but the finger has not yet". He denies any new symptoms, no increase in swelling no warmth, no drainage that he could tell, I advised him that he has an appointment tomorrow with Dr.Dahl and we can evaluate him tomorrow when he comes in         [place], [place]  03/18/2021, 10:56

## 2021-03-19 ENCOUNTER — Encounter (HOSPITAL_BASED_OUTPATIENT_CLINIC_OR_DEPARTMENT_OTHER): Payer: Self-pay | Admitting: Orthopaedic Surgery

## 2021-03-19 ENCOUNTER — Ambulatory Visit: Payer: Worker's Compensation | Attending: Orthopaedic Surgery | Admitting: Orthopaedic Surgery

## 2021-03-19 ENCOUNTER — Other Ambulatory Visit: Payer: Self-pay

## 2021-03-19 DIAGNOSIS — T709XXD Effect of air pressure and water pressure, unspecified, subsequent encounter: Secondary | ICD-10-CM

## 2021-03-19 DIAGNOSIS — Z9889 Other specified postprocedural states: Secondary | ICD-10-CM | POA: Insufficient documentation

## 2021-03-19 DIAGNOSIS — T709XXA Effect of air pressure and water pressure, unspecified, initial encounter: Secondary | ICD-10-CM | POA: Insufficient documentation

## 2021-03-19 NOTE — Progress Notes (Signed)
dictated

## 2021-03-19 NOTE — Progress Notes (Signed)
PATIENT NAME: Jorge Maddox  HOSPITAL NUMBER:  [id number]  DATE OF SERVICE: 03/19/2021  DATE OF BIRTH:  07/14/1985    CLINIC RETURN/FOLLOW UP    DIAGNOSIS:  Status post left middle finger irrigation and debridement after a pressure washer injury sustained at work, surgery on March 05, 2021.    SUBJECTIVE:  Jorge Maddox returns to clinic today.  He reports stiffness in the middle finger.  He reports some swelling and a sensation of warmth of the digit.  Denies any drainage.  Denies any problems with the wound.    OBJECTIVE:  Examination of the patient's left upper extremity reveals the wounds to be healing up nicely.  He has intact FDS and FDP function.  No concern for active ongoing infection at this time.    ASSESSMENT:  Doing well after the above-listed surgery.    PLAN:  1. Remaining chromic sutures snipped off at the knot.  2. We discussed hand exercises to work on.  We will get him in formal occupational therapy.  3. Transition to regular handwashing with soap and water, covering the wound with a small amount of Vaseline followed by Band-Aid.  4. We will provide him with a work note saying no lifting over 5 pounds with the left hand for the next 4 weeks.  5. Return visit in 2 weeks to see how he is doing.        Dicie Beam, MD  [facility]              DD:  03/19/2021 08:28:17  DT:  03/19/2021 11:18:31 TAW  D#:  [id number]

## 2021-03-22 ENCOUNTER — Ambulatory Visit (HOSPITAL_BASED_OUTPATIENT_CLINIC_OR_DEPARTMENT_OTHER): Payer: Self-pay

## 2021-03-23 ENCOUNTER — Other Ambulatory Visit: Payer: Self-pay

## 2021-03-23 ENCOUNTER — Ambulatory Visit (HOSPITAL_BASED_OUTPATIENT_CLINIC_OR_DEPARTMENT_OTHER): Payer: Worker's Compensation

## 2021-03-23 DIAGNOSIS — T709XXA Effect of air pressure and water pressure, unspecified, initial encounter: Secondary | ICD-10-CM | POA: Insufficient documentation

## 2021-03-23 DIAGNOSIS — Z9889 Other specified postprocedural states: Secondary | ICD-10-CM | POA: Insufficient documentation

## 2021-03-23 NOTE — Progress Notes (Signed)
Subjective Evaluation    History of Present Illness  Date of onset: 03/02/2021    Quality of life: good    Pain  Current pain rating: 0  At best pain rating: 0  At worst pain rating: 4  Location: Left Hand  Quality: dull ache, sharp and discomfort  Relieving factors: rest and medications  Aggravating factors: movement and lifting  Progression: no change    Social Support  Lives in: multiple-level home  Lives with: spouse and young children    Treatments  Current treatment: physical therapy  Patient Goals  Patient goals for therapy: decreased edema, decreased pain, increased motion, increased strength, return to sport/leisure activities and return to work    Precautions: None that Apply           Objective     Active Range of Motion     Left Wrist   Wrist flexion: WFL  Wrist extension: WFL  Radial deviation: WFL  Ulnar deviation: WFL      Left Digits    Flexion   Index     MCP: 55    PIP: 85    DIP: 40  Middle     MCP: 55    PIP: 70    DIP: 40  Ring     MCP: 50    PIP: 90    DIP: 35  Little     MCP: 55    PIP: 90    DIP: 40  Extension   Index     MCP: 0    PIP: 0    DIP: 0  Middle     MCP: 0    PIP: 0    DIP: 0  Ring     MCP: 0    PIP: 0    DIP: 0  Little     MCP: 0    PIP: 0    DIP: 0    Additional Active Range of Motion Details  TAM:    IF:  180 Degrees  MF: 165 Degrees  RF:  175 Degrees  SF:  185 Degrees    Swelling     Left Wrist/Hand   Circumference MCP: 23.6 cm  Circumference wrist: 20 cm    Right Wrist/Hand   Circumference MCP: 23.5 cm  Circumference wrist: 19.6 cm    General Comments     Wrist/Hand Comments  Timed: 45 min  Un-timed: 10 min  Total: 55 min    Patient presents 3 weeks 0 days s/p left hand pressure injection injury with decreased ROM, decreased strength, and decreased functional use of the left UE. Initial evaluation x 20 min completed this date, see IE for details. Following IE, Moist heat x 10 min applied to the left hand to increase soft tissue extensibility and comfort during treatment.  Manual/PROM x 15 min completed to the left hand in order to increase joint mobility. Ther ex/AROM x 10 min of the left hand completed this date to increase ROM for functional activity. Patient educated in HEP to be completed as indicated in the handouts.     Visit: 1       Test  Quick Dash: 43.2%        Assessment & Plan     Assessment  Impairments: abnormal or restricted ROM, activity intolerance, impaired physical strength, lacks appropriate home exercise program and pain with function  Prognosis: good    Goals    1) Patient will be independent with Home Exercise Program in 1 weeks.  2) Patient will score 22.7% on the Modified Dash in 12 weeks  3) Patient will report 2/10 pain or < during activity by discharge  4) Through increases ROM and Strength, Patient will return to full time/full duty work in 12 weeks    Plan  Therapy options: will be seen for skilled physical therapy services  Planned modality interventions: cryotherapy, fluidotherapy, high voltage pulsed current (pain management), thermotherapy (hydrocollator packs) and ultrasound  Planned therapy interventions: functional ROM exercises, home exercise program, joint mobilization, manual therapy, soft tissue mobilization, strengthening, stretching and therapeutic activities  Frequency: 2x week  Duration in weeks: 12  Treatment plan discussed with: patient        [id number] - PT EVAL - HIGH  CODE 16010, 932355 - PT MANUAL THERAPY (15)  CODE 73220, 254270 - PT NEUROMUSCULAR RE-ED (15)  CODE 62376, 283151 - PT THERAPEUTIC EXERCISE (15)  CODE 76160, 737106 - PT THERAPEUTIC ACTIVITY (15)  CODE 26948, 546270 - PT ULTRASOUND (15)  CODE 35009, 381829 - PT ELECTRICAL STIM UNATTENDED  CODE H3716, 967893 - PT VASOPNEUMATIC COMPRESSION  CODE 81017 and 510258 - PT FLUIDOTHERAPY  CODE 52778

## 2021-03-25 ENCOUNTER — Other Ambulatory Visit: Payer: Self-pay

## 2021-03-25 ENCOUNTER — Ambulatory Visit: Payer: Worker's Compensation | Attending: INTERNAL MEDICINE

## 2021-03-25 DIAGNOSIS — Z9889 Other specified postprocedural states: Secondary | ICD-10-CM

## 2021-03-25 DIAGNOSIS — M79642 Pain in left hand: Secondary | ICD-10-CM | POA: Insufficient documentation

## 2021-03-25 NOTE — Progress Notes (Signed)
Pain  Current pain rating: 0  At best pain rating: 0  At worst pain rating: 4  Location: Left Hand  Quality: dull ache, sharp and discomfort  Relieving factors: rest and medications  Aggravating factors: movement and lifting  Progression: no change    Social Support  Lives in: multiple-level home  Lives with: spouse and young children    Treatments  Current treatment: physical therapy  Patient Goals  Patient goals for therapy: decreased edema, decreased pain, increased motion, increased strength, return to sport/leisure activities and return to work    Precautions: None that Apply           Objective     Active Range of Motion     Left Wrist   Wrist flexion: WFL  Wrist extension: WFL  Radial deviation: WFL  Ulnar deviation: WFL      Left Digits    Flexion   Index     MCP: 55    PIP: 85    DIP: 40  Middle     MCP: 55    PIP: 70    DIP: 40  Ring     MCP: 50    PIP: 90    DIP: 35  Little     MCP: 55    PIP: 90    DIP: 40  Extension   Index     MCP: 0    PIP: 0    DIP: 0  Middle     MCP: 0    PIP: 0    DIP: 0  Ring     MCP: 0    PIP: 0    DIP: 0  Little     MCP: 0    PIP: 0    DIP: 0    Additional Active Range of Motion Details  TAM:    IF:  180 Degrees  MF: 165 Degrees  RF:  175 Degrees  SF:  185 Degrees    Swelling     Left Wrist/Hand   Circumference MCP: 23.6 cm  Circumference wrist: 20 cm    Right Wrist/Hand   Circumference MCP: 23.5 cm  Circumference wrist: 19.6 cm    General Comments     Wrist/Hand Comments  Timed: 35 min  Un-timed: 25  min  Total: 60  min    Patient presents 3 weeks 2 days s/p left hand pressure injection injury with decreased ROM, decreased strength, and decreased functional use of the left UE. Moist heat x 10 min applied to the left hand to increase soft tissue extensibility and comfort during treatment. Manual/PROM x 25 min completed to the left hand in order to increase joint mobility. Ther ex/AROM x 10 min of the left hand completed this date to increase ROM for functional activity. High  volt e-stim to decrease pain x 15     Visit: 2       Test  Quick Dash: 43.2%

## 2021-03-30 ENCOUNTER — Other Ambulatory Visit: Payer: Self-pay

## 2021-03-30 ENCOUNTER — Ambulatory Visit (HOSPITAL_BASED_OUTPATIENT_CLINIC_OR_DEPARTMENT_OTHER): Payer: Worker's Compensation

## 2021-03-30 DIAGNOSIS — T709XXA Effect of air pressure and water pressure, unspecified, initial encounter: Secondary | ICD-10-CM

## 2021-03-31 NOTE — Progress Notes (Signed)
History of Present Illness  Date of onset: 03/02/2021    Quality of life: good        Social Support  Lives in: multiple-level home  Lives with: spouse and young children    Treatments  Current treatment: physical therapy  Patient Goals  Patient goals for therapy: decreased edema, decreased pain, increased motion, increased strength, return to sport/leisure activities and return to work    Precautions: None that Apply      History of Present Illness  Date of onset: 03/02/2021    Quality of life: good    Pain  Current pain rating: 0  At best pain rating: 0  At worst pain rating: 4  Location: Left Hand  Quality: dull ache, sharp and discomfort  Relieving factors: rest and medications  Aggravating factors: movement and lifting  Progression: no change    Social Support  Lives in: multiple-level home  Lives with: spouse and young children    Treatments  Current treatment: physical therapy  Patient Goals  Patient goals for therapy: decreased edema, decreased pain, increased motion, increased strength, return to sport/leisure activities and return to work    Precautions: None that Apply        Wrist/Hand Comments  Timed: 35 min  Un-timed: 25  min  Total: 60  min    Patient presents 4 weeks 0 days s/p left hand pressure injection injury with decreased ROM, decreased strength, and decreased functional use of the left UE. Patient reports 0/10 pain upon entry to the clinic.  Moist heat x 10 min applied to the left hand to increase soft tissue extensibility and comfort during treatment. Manual/PROM x 25 min completed to the left hand in order to increase joint mobility. Ther ex/AROM x 10 min of the left hand completed this date to increase ROM for functional activity. High volt e-stim to decrease pain x 15     Visit: 3       Test  Quick Dash: 43.2%

## 2021-04-01 ENCOUNTER — Ambulatory Visit (HOSPITAL_BASED_OUTPATIENT_CLINIC_OR_DEPARTMENT_OTHER): Payer: Worker's Compensation

## 2021-04-01 ENCOUNTER — Other Ambulatory Visit: Payer: Self-pay

## 2021-04-01 DIAGNOSIS — T709XXA Effect of air pressure and water pressure, unspecified, initial encounter: Secondary | ICD-10-CM

## 2021-04-01 NOTE — Progress Notes (Signed)
History of Present Illness  Date of onset: 03/02/2021    Quality of life: good        Social Support  Lives in: multiple-level home  Lives with: spouse and young children    Treatments  Current treatment: physical therapy  Patient Goals  Patient goals for therapy: decreased edema, decreased pain, increased motion, increased strength, return to sport/leisure activities and return to work    Precautions: None that Apply      History of Present Illness  Date of onset: 03/02/2021    Quality of life: good    Pain  Current pain rating: 0  At best pain rating: 0  At worst pain rating: 4  Location: Left Hand  Quality: dull ache, sharp and discomfort  Relieving factors: rest and medications  Aggravating factors: movement and lifting  Progression: no change    Social Support  Lives in: multiple-level home  Lives with: spouse and young children    Treatments  Current treatment: physical therapy  Patient Goals  Patient goals for therapy: decreased edema, decreased pain, increased motion, increased strength, return to sport/leisure activities and return to work    Precautions: None that Apply        Wrist/Hand Comments  Timed: 25 min  Un-timed: 25  min  Total: 50  min    Patient presents 4 weeks 2 days s/p left hand pressure injection injury with decreased ROM, decreased strength, and decreased functional use of the left UE. Patient reports increased discomfort in the scar tissue on the volar aspect of the MP and onto the proximal phalanx of the digit,  1-2/10 pain upon entry to the clinic.  Moist heat x 10 min applied to the left hand to increase soft tissue extensibility and comfort during treatment. Manual/PROM x 25 min completed to the left hand in order to increase joint mobility. Ther ex/AROM of the left hand HELD this date to increase ROM for functional activity. High volt e-stim to decrease pain x 15     Visit: 4       Test  Quick Dash: 43.2%

## 2021-04-02 ENCOUNTER — Encounter (HOSPITAL_BASED_OUTPATIENT_CLINIC_OR_DEPARTMENT_OTHER): Payer: Self-pay | Admitting: Physician Assistant

## 2021-04-02 ENCOUNTER — Ambulatory Visit: Payer: Worker's Compensation | Attending: Physician Assistant | Admitting: Orthopaedic Surgery

## 2021-04-02 DIAGNOSIS — Z9889 Other specified postprocedural states: Secondary | ICD-10-CM | POA: Insufficient documentation

## 2021-04-02 DIAGNOSIS — S6982XA Other specified injuries of left wrist, hand and finger(s), initial encounter: Secondary | ICD-10-CM | POA: Insufficient documentation

## 2021-04-02 NOTE — Progress Notes (Signed)
dictated

## 2021-04-04 NOTE — Progress Notes (Unsigned)
PATIENT NAME: Jorge Maddox, Jorge Maddox  HOSPITAL NUMBER:  [id number]  DATE OF SERVICE: 04/02/2021  DATE OF BIRTH:  May 31, 1986    CLINIC RETURN/FOLLOW UP    DIAGNOSIS:  Status post March 05, 2021, left middle finger irrigation and debridement and flexor tenosynovectomy.    SUBJECTIVE:  Mr. Jorge Maddox returns to clinic today.  He reports he is doing well.  He is working with therapy on the scar with some silicone sheeting and scar softening techniques.  He overall is pleased with his progress.  He has a little bit of stiffness, but not bad.    OBJECTIVE:  Examination of the left upper extremity reveals the skin to be intact.  Very small area still healing at the corner of the level of the web space.  Intact sensation to light touch in the middle finger.  Fingertip to palm distance of 1 cm.  He has 5/5 strength in the thenars, 5/5 strength in his first dorsal interosseous function.  No sign of infection.    ASSESSMENT:  Status post above-listed surgery, doing well.    PLAN:  1. Increase lifting restriction to 20 pounds for the next 4 weeks.  2. Continue with occupational therapy.  3. Follow up with me in 4 weeks for a final check.  At that point, I anticipate releasing him to full duty with no restrictions.        Dicie Beam, MD  [facility]              DD:  04/02/2021 11:00:31  DT:  04/04/2021 13:37:13 CB  D#:  [id number]

## 2021-04-05 ENCOUNTER — Encounter (HOSPITAL_BASED_OUTPATIENT_CLINIC_OR_DEPARTMENT_OTHER): Payer: Self-pay | Admitting: Physician Assistant

## 2021-04-06 ENCOUNTER — Ambulatory Visit: Payer: Worker's Compensation | Attending: INTERNAL MEDICINE

## 2021-04-06 ENCOUNTER — Other Ambulatory Visit: Payer: Self-pay

## 2021-04-06 DIAGNOSIS — T709XXA Effect of air pressure and water pressure, unspecified, initial encounter: Secondary | ICD-10-CM | POA: Insufficient documentation

## 2021-04-06 DIAGNOSIS — S6982XA Other specified injuries of left wrist, hand and finger(s), initial encounter: Secondary | ICD-10-CM

## 2021-04-06 DIAGNOSIS — Z9889 Other specified postprocedural states: Secondary | ICD-10-CM | POA: Insufficient documentation

## 2021-04-06 LAB — FUNGUS CULTURE: FUNGAL CULTURE: NO GROWTH

## 2021-04-07 ENCOUNTER — Ambulatory Visit (HOSPITAL_BASED_OUTPATIENT_CLINIC_OR_DEPARTMENT_OTHER): Payer: Worker's Compensation

## 2021-04-07 DIAGNOSIS — S6990XA Unspecified injury of unspecified wrist, hand and finger(s), initial encounter: Secondary | ICD-10-CM

## 2021-04-07 DIAGNOSIS — T709XXA Effect of air pressure and water pressure, unspecified, initial encounter: Secondary | ICD-10-CM

## 2021-04-07 NOTE — Progress Notes (Signed)
History of Present Illness  Date of onset: 03/02/2021    Quality of life: good        Social Support  Lives in: multiple-level home  Lives with: spouse and young children    Treatments  Current treatment: physical therapy  Patient Goals  Patient goals for therapy: decreased edema, decreased pain, increased motion, increased strength, return to sport/leisure activities and return to work    Precautions: None that Apply      History of Present Illness  Date of onset: 03/02/2021    Quality of life: good    Pain  Current pain rating: 0  At best pain rating: 0  At worst pain rating: 4  Location: Left Hand  Quality: dull ache, sharp and discomfort  Relieving factors: rest and medications  Aggravating factors: movement and lifting  Progression: no change    Social Support  Lives in: multiple-level home  Lives with: spouse and young children    Treatments  Current treatment: physical therapy  Patient Goals  Patient goals for therapy: decreased edema, decreased pain, increased motion, increased strength, return to sport/leisure activities and return to work    Precautions: None that Apply        Wrist/Hand Comments  Timed: 23 min  Un-timed: 25  min  Total: 48  min    Patient presents 5 weeks 1 days s/p left hand pressure injection injury with decreased ROM, decreased strength, and decreased functional use of the left UE. Patient reports increased discomfort in the scar tissue on the volar aspect of the MP and onto the proximal phalanx of the digit,  2/10 pain upon entry to the clinic.  Moist heat x 10 min applied to the left hand to increase soft tissue extensibility and comfort during treatment. Manual/PROM x 15 min completed to the left hand in order to increase joint mobility. Ther ex of the left UE x 8 min completed this date using 25# dumbbell held stationary in the left hand to focus on the grip strength of the left hand. Patient able to hold weight 3 times for 1 minute in duration with rest between sets.  High volt  e-stim to decrease pain x 15 min     Visit: 6       Test  Quick Dash: 43.2%

## 2021-04-07 NOTE — Progress Notes (Signed)
History of Present Illness  Date of onset: 03/02/2021    Quality of life: good        Social Support  Lives in: multiple-level home  Lives with: spouse and young children    Treatments  Current treatment: physical therapy  Patient Goals  Patient goals for therapy: decreased edema, decreased pain, increased motion, increased strength, return to sport/leisure activities and return to work    Precautions: None that Apply      History of Present Illness  Date of onset: 03/02/2021    Quality of life: good    Pain  Current pain rating: 0  At best pain rating: 0  At worst pain rating: 4  Location: Left Hand  Quality: dull ache, sharp and discomfort  Relieving factors: rest and medications  Aggravating factors: movement and lifting  Progression: no change    Social Support  Lives in: multiple-level home  Lives with: spouse and young children    Treatments  Current treatment: physical therapy  Patient Goals  Patient goals for therapy: decreased edema, decreased pain, increased motion, increased strength, return to sport/leisure activities and return to work    Precautions: None that Apply        Wrist/Hand Comments  Timed: 23 min  Un-timed: 25  min  Total: 48  min    Patient presents 5 weeks 0 days s/p left hand pressure injection injury with decreased ROM, decreased strength, and decreased functional use of the left UE. Patient reports increased discomfort in the scar tissue on the volar aspect of the MP and onto the proximal phalanx of the digit,  1-2/10 pain upon entry to the clinic.  Moist heat x 10 min applied to the left hand to increase soft tissue extensibility and comfort during treatment. Manual/PROM x 15 min completed to the left hand in order to increase joint mobility. Ther ex of the left UE x 8 min initiated this date using 20# dumbbell held stationary in the left hand to focus on the grip strength of the left hand. Patient able to hold weight 3 times ranging from 30 sec to 1 minute in duration with rest  between sets.  High volt e-stim to decrease pain x 15     Visit: 5       Test  Quick Dash: 43.2%

## 2021-04-07 NOTE — Progress Notes (Deleted)
Subjective Evaluation    History of Present Illness  Date of onset: 03/24/2021  Mechanism of injury: Finger injury     Quality of life: good    Pain  Current pain rating: 0  At best pain rating: 0  At worst pain rating: 3  Location: finger 5th  Quality: pressure, tight and discomfort  Relieving factors: relaxation and support  Aggravating factors: movement  Progression: no change    Social Support  [facility]: [facility].    Hand dominance: left    Patient Goals  Patient goals for therapy: increased motion    Precautions: None that Apply           Objective  ORTHOTIC EVALUATION    Physician:       Chrissie Noa dahl  Diagnosis:   boutonierre deformity    Date Of Injury:            03/24/21                                Date Of Surgery:     Edema:          Soft Tissue Integrity:          Sensibility:          ROM:       Today's Treatment:          __x___ Orthosis:                                                                  ____x_ HEP    Orthosis Purpose:    __x__ Protect Healing Structures     ____ Increase ROM                  _x___ Restrict Joint Mobility          ____ Improve Joint Alignment                  ____ Other:    Instructions:              __x__ Don/Doff     _x___ Orthotic Care     ___x_ Wear Schedule  x                                      _x___ Skin Care/Precautions     x ____ Written Instructions    Assessment: Patient presents this date with h/o __ for orthotic fit/train.      Goals: Patient to be independent with orthotic wear/care by the end of the 1st treatment    Plan:                                                 Fabricated FO\S educated Pt. On wear and care of the Orthosis              ORTHOSIS INSTRUCTIONS    Date:  04/07/21    Orthosis Goals:  To align fiinger  Orthotic Wear Instructions:  Your orthosis is to be worn:      ____  Day:     ____  Night:      __x__  At All Times    Other:      Orthosis Care:   The orthosis was fabricated from thermoplastic, keep it away from direct  sunlight or heat sources because it may melt (i.e. Dashboard of car, stove, furnace, etc)   Orthosis may be washed with Rubbing Alcohol or warm soap and water and allowed to air-dry   The removable straps may be hand washed and air-dried as well   If a sleeve is worn with the orthosis, it may be washed and air-dried      Precautions:  The orthosis should not cause:   Pressure areas or redness on the skin that do not resolve within 15 minutes (Pay attention to orthosis edges and over bony areas)   Restriction of blood flow (noted by changes in skin color, I.e deep red, purple, or white)   Skin irritation (rash or itching)   Increased swelling (if swelling is between straps, loosen straps)   Increased numbness or tingling    Contact your therapist if you experience any of the above symptoms    _x___ Patient/caregiver instructed on orthotic wear/care and instructions. Written instructions reviewed and issued with good understanding                   Assessment/Plan

## 2021-04-09 ENCOUNTER — Other Ambulatory Visit: Payer: Self-pay

## 2021-04-09 ENCOUNTER — Ambulatory Visit (HOSPITAL_BASED_OUTPATIENT_CLINIC_OR_DEPARTMENT_OTHER): Payer: Worker's Compensation

## 2021-04-09 DIAGNOSIS — S6982XA Other specified injuries of left wrist, hand and finger(s), initial encounter: Secondary | ICD-10-CM

## 2021-04-09 NOTE — Progress Notes (Signed)
History of Present Illness  Date of onset: 03/02/2021    Quality of life: good        Social Support  Lives in: multiple-level home  Lives with: spouse and young children    Treatments  Current treatment: physical therapy  Patient Goals  Patient goals for therapy: decreased edema, decreased pain, increased motion, increased strength, return to sport/leisure activities and return to work    Precautions: None that Apply      History of Present Illness  Date of onset: 03/02/2021    Quality of life: good    Pain  Current pain rating: 0  At best pain rating: 0  At worst pain rating: 4  Location: Left Hand  Quality: dull ache, sharp and discomfort  Relieving factors: rest and medications  Aggravating factors: movement and lifting  Progression: no change    Social Support  Lives in: multiple-level home  Lives with: spouse and young children    Treatments  Current treatment: physical therapy  Patient Goals  Patient goals for therapy: decreased edema, decreased pain, increased motion, increased strength, return to sport/leisure activities and return to work    Precautions: None that Apply        Wrist/Hand Comments  Timed: 29 min  Un-timed: 25  min  Total: 54  min    Patient presents 5 weeks 3 days s/p left hand pressure injection injury with decreased ROM, decreased strength, and decreased functional use of the left UE. Patient reports increased discomfort in the scar tissue on the volar aspect of the MP and onto the proximal phalanx of the digit,  0/10 pain upon entry to the clinic.  Moist heat x 10 min applied to the left hand to increase soft tissue extensibility and comfort during treatment. Manual/PROM and STM  x 21 min completed to the left hand in order to increase joint mobility and decrease hypersensitivity. Ther ex of the left UE x 8 min completed this date using 25# dumbbell held stationary in the left hand to focus on the grip strength of the left hand. Patient able to hold weight 3 times for 1 minute in  duration with rest between sets.  High volt e-stim to decrease pain x 15 min. ROM assessment of the left hand completed this date.      Visit: 7      Test  Quick Dash: 43.2%

## 2021-04-13 ENCOUNTER — Ambulatory Visit (HOSPITAL_BASED_OUTPATIENT_CLINIC_OR_DEPARTMENT_OTHER): Payer: Worker's Compensation

## 2021-04-13 ENCOUNTER — Other Ambulatory Visit: Payer: Self-pay

## 2021-04-13 DIAGNOSIS — S6982XA Other specified injuries of left wrist, hand and finger(s), initial encounter: Secondary | ICD-10-CM

## 2021-04-13 DIAGNOSIS — S6990XA Unspecified injury of unspecified wrist, hand and finger(s), initial encounter: Secondary | ICD-10-CM

## 2021-04-13 NOTE — Progress Notes (Signed)
History of Present Illness  Date of onset: 03/02/2021    Quality of life: good        Social Support  Lives in: multiple-level home  Lives with: spouse and young children    Treatments  Current treatment: physical therapy  Patient Goals  Patient goals for therapy: decreased edema, decreased pain, increased motion, increased strength, return to sport/leisure activities and return to work    Precautions: None that Apply      History of Present Illness  Date of onset: 03/02/2021    Quality of life: good    Pain  Current pain rating: 0  At best pain rating: 0  At worst pain rating: 4  Location: Left Hand  Quality: dull ache, sharp and discomfort  Relieving factors: rest and medications  Aggravating factors: movement and lifting  Progression: no change    Social Support  Lives in: multiple-level home  Lives with: spouse and young children    Treatments  Current treatment: physical therapy  Patient Goals  Patient goals for therapy: decreased edema, decreased pain, increased motion, increased strength, return to sport/leisure activities and return to work    Precautions: None that Apply        Wrist/Hand Comments  Timed: 30 min  Un-timed: 25  min  Total: 55  min    Patient presents 6 weeks 0 days s/p left hand pressure injection injury with decreased ROM, decreased strength, and decreased functional use of the left UE. Patient reports increased discomfort in the scar tissue on the volar aspect of the MP and onto the proximal phalanx of the digit,  0/10 pain upon entry to the clinic.  Moist heat x 10 min applied to the left hand to increase soft tissue extensibility and comfort during treatment. Manual/PROM and STM  x 20 min completed to the left hand in order to increase joint mobility and decrease hypersensitivity. Ther ex of the left UE x 10 min completed this date using 25# dumbbell held stationary in the left hand to focus on the grip strength of the left hand. Patient able to hold weight 3 times for 1 minute in  duration with rest between sets.  High volt e-stim to decrease pain x 15 min. ROM assessment of the left hand completed this date.      Visit: 8      Test  Quick Dash: 43.2%

## 2021-04-14 ENCOUNTER — Ambulatory Visit (HOSPITAL_BASED_OUTPATIENT_CLINIC_OR_DEPARTMENT_OTHER): Payer: Worker's Compensation

## 2021-04-14 DIAGNOSIS — T709XXA Effect of air pressure and water pressure, unspecified, initial encounter: Secondary | ICD-10-CM

## 2021-04-15 ENCOUNTER — Ambulatory Visit (HOSPITAL_BASED_OUTPATIENT_CLINIC_OR_DEPARTMENT_OTHER): Payer: Self-pay

## 2021-04-16 ENCOUNTER — Ambulatory Visit: Payer: Worker's Compensation | Attending: INTERNAL MEDICINE

## 2021-04-16 ENCOUNTER — Other Ambulatory Visit: Payer: Self-pay

## 2021-04-16 DIAGNOSIS — S6982XA Other specified injuries of left wrist, hand and finger(s), initial encounter: Secondary | ICD-10-CM

## 2021-04-16 NOTE — Progress Notes (Addendum)
History of Present Illness  Date of onset: 03/02/2021    Quality of life: good        Social Support  Lives in: multiple-level home  Lives with: spouse and young children    Treatments  Current treatment: physical therapy  Patient Goals  Patient goals for therapy: decreased edema, decreased pain, increased motion, increased strength, return to sport/leisure activities and return to work    Precautions: None that Apply      History of Present Illness  Date of onset: 03/02/2021    Quality of life: good    Pain  Current pain rating: 0  At best pain rating: 0  At worst pain rating: 4  Location: Left Hand  Quality: dull ache, sharp and discomfort  Relieving factors: rest and medications  Aggravating factors: movement and lifting  Progression: no change    Social Support  Lives in: multiple-level home  Lives with: spouse and young children    Treatments  Current treatment: physical therapy  Patient Goals  Patient goals for therapy: decreased edema, decreased pain, increased motion, increased strength, return to sport/leisure activities and return to work    Precautions: None that Apply        Wrist/Hand Comments  Timed: 35 min  Un-timed: 25  min  Total: 60  min    Patient presents 6 weeks 1 days s/p left hand pressure injection injury with decreased ROM, decreased strength, and decreased functional use of the left UE. Patient reports increased discomfort in the scar tissue on the volar aspect of the MP and onto the proximal phalanx of the digit,  0/10 pain upon entry to the clinic.  Moist heat x 10 min applied to the left hand to increase soft tissue extensibility and comfort during treatment. Manual/PROM and STM  x 15 min completed to the left hand in order to increase joint mobility and decrease hypersensitivity. Ther ex/Strengthening x 20 min initiated using cable weights completing bicep curls, front shoulder raises, and side shoulder raises 3 sets of 10 reps.  High volt e-stim to decrease pain x 15 min. ROM  assessment of the left hand completed this date.  ROM has improved significantly since initial evaluation as noted below. Patient demo's significant decrease in grip strength in the left hand as noted below. Patient could benefit from OT to address grip strength deficits to allow patient to return to work and daily activity at full duty.     Visit: 9    Left Digits    Flexion   Index     MCP: 75    PIP: 95    DIP: 75  Middle     MCP: 75    PIP: 90    DIP: 75  Ring     MCP: 70    PIP: 95    DIP: 70  Little     MCP: 70    PIP: 90    DIP: 70  Extension   Index     MCP: 0    PIP: 0    DIP: 0  Middle     MCP: 0    PIP: 0    DIP: 0  Ring     MCP: 0    PIP: 0    DIP: 0  Little     MCP: 0    PIP: 0    DIP: 0    Additional Active Range of Motion Details  TAM:    IF:  245 Degrees (^  65 degrees)  MF: 240 Degrees (^ 75 degree)  RF:  235 Degrees (^ 60 degrees)  SF:  230 Degrees (^ 45 degrees)    Grip Strength:    Right: 115 #  Left: 80#      Test  Quick Dash: 43.2%

## 2021-04-18 LAB — AFB CULTURE WITH STAIN: AFB SMEAR: NEGATIVE

## 2021-04-21 NOTE — Progress Notes (Signed)
History of Present Illness  Date of onset: 03/02/2021    Quality of life: good        Social Support  Lives in: multiple-level home  Lives with: spouse and young children    Treatments  Current treatment: physical therapy  Patient Goals  Patient goals for therapy: decreased edema, decreased pain, increased motion, increased strength, return to sport/leisure activities and return to work    Precautions: None that Apply      History of Present Illness  Date of onset: 03/02/2021    Quality of life: good        Social Support  Lives in: multiple-level home  Lives with: spouse and young children    Treatments  Current treatment: physical therapy  Patient Goals  Patient goals for therapy: decreased edema, decreased pain, increased motion, increased strength, return to sport/leisure activities and return to work    Precautions: None that Apply        Wrist/Hand Comments  Timed: 35 min  Un-timed: 25  min  Total: 60  min    Patient presents 6 weeks 3 days s/p left hand pressure injection injury with decreased ROM, decreased strength, and decreased functional use of the left UE. Patient reports increased discomfort in the scar tissue on the volar aspect of the MP and onto the proximal phalanx of the digit,  0/10 pain upon entry to the clinic.  Moist heat x 10 min applied to the left hand to increase soft tissue extensibility and comfort during treatment. Manual/PROM and STM  x 15 min completed to the left hand in order to increase joint mobility and decrease hypersensitivity. Ther ex/Strengthening x 20 min completed using cable weights completing bicep curls, front shoulder raises, and side shoulder raises 3 sets of 10 reps.  High volt e-stim to decrease pain x 15 min.      Visit: 10

## 2021-04-30 ENCOUNTER — Other Ambulatory Visit: Payer: Self-pay

## 2021-04-30 ENCOUNTER — Encounter (HOSPITAL_BASED_OUTPATIENT_CLINIC_OR_DEPARTMENT_OTHER): Payer: Self-pay | Admitting: Orthopaedic Surgery

## 2021-04-30 ENCOUNTER — Ambulatory Visit: Payer: Worker's Compensation | Attending: Orthopaedic Surgery | Admitting: Orthopaedic Surgery

## 2021-04-30 DIAGNOSIS — L089 Local infection of the skin and subcutaneous tissue, unspecified: Secondary | ICD-10-CM | POA: Insufficient documentation

## 2021-04-30 DIAGNOSIS — S6982XA Other specified injuries of left wrist, hand and finger(s), initial encounter: Secondary | ICD-10-CM | POA: Insufficient documentation

## 2021-04-30 DIAGNOSIS — S6982XD Other specified injuries of left wrist, hand and finger(s), subsequent encounter: Secondary | ICD-10-CM

## 2021-04-30 DIAGNOSIS — T709XXA Effect of air pressure and water pressure, unspecified, initial encounter: Secondary | ICD-10-CM | POA: Insufficient documentation

## 2021-04-30 DIAGNOSIS — Z9889 Other specified postprocedural states: Secondary | ICD-10-CM | POA: Insufficient documentation

## 2021-04-30 DIAGNOSIS — T709XXD Effect of air pressure and water pressure, unspecified, subsequent encounter: Secondary | ICD-10-CM

## 2021-04-30 DIAGNOSIS — M659 Synovitis and tenosynovitis, unspecified: Secondary | ICD-10-CM

## 2021-04-30 NOTE — Progress Notes (Signed)
PATIENT NAME: MASSIMO, HARTLAND  HOSPITAL NUMBER:  [id number]  DATE OF SERVICE: 04/30/2021  DATE OF BIRTH:  10-29-1985    CLINIC RETURN/FOLLOW UP    DIAGNOSIS:  Status post left middle finger irrigation and debridement.    SUBJECTIVE:  Mr. Dalto returns to clinic today.  He reports he is doing well.  He denies any numbness or tingling.  He denies major pain.  He does rate his pain at 1/10 when he has it.  Cold increases his pain.  Heat decreases the pain.  He is not taking any pain medication.    OBJECTIVE:  Examination of the patient's left upper extremity reveals surgical incisions to be healed up nicely.  Patient has near full motion.  He may be lacking 10-20 degrees of terminal flexion at most.  He has 5/5 strength.  The scar is healing up nicely.  No concern for infection.  Minimal to no swelling present.    ASSESSMENT:  Status post above-listed surgery, doing well.    PLAN:  1. I discussed with Mr. Zielke that he is doing great.  I anticipate that his scar will continue to mature over the next 3 months or so.  We discussed that he should keep an eye out for web creep.  We discussed what that is, but I do not think it will be a functional problem for him.  2. He will go stop by occupational therapy today to get a new scar thing fitted up for him.  3. We will release him to full duty work effective tomorrow.  4. Follow up with me on an as-needed basis in the future.  I am happy to see him if he has any questions or concerns moving forward.        Dicie Beam, MD  [facility]              DD:  04/30/2021 09:40:00  DT:  04/30/2021 09:59:11 NW  D#:  [id number]

## 2021-06-20 ENCOUNTER — Ambulatory Visit (INDEPENDENT_AMBULATORY_CARE_PROVIDER_SITE_OTHER): Payer: 59

## 2021-06-20 ENCOUNTER — Encounter (INDEPENDENT_AMBULATORY_CARE_PROVIDER_SITE_OTHER): Payer: Self-pay

## 2021-06-20 ENCOUNTER — Other Ambulatory Visit: Payer: Self-pay

## 2021-06-20 VITALS — BP 124/79 | HR 90 | Temp 97.6°F | Resp 18 | Ht 73.0 in | Wt 230.0 lb

## 2021-06-20 DIAGNOSIS — J40 Bronchitis, not specified as acute or chronic: Secondary | ICD-10-CM

## 2021-06-20 MED ORDER — PREDNISONE 20 MG TABLET
40.0000 mg | ORAL_TABLET | Freq: Every day | ORAL | 0 refills | Status: DC
Start: 2021-06-20 — End: 2023-02-08

## 2021-06-20 MED ORDER — BENZONATATE 200 MG CAPSULE
200.0000 mg | ORAL_CAPSULE | Freq: Three times a day (TID) | ORAL | 0 refills | Status: DC | PRN
Start: 2021-06-20 — End: 2023-02-08

## 2021-06-20 MED ORDER — ALBUTEROL SULFATE HFA 90 MCG/ACTUATION AEROSOL INHALER
2.0000 | INHALATION_SPRAY | RESPIRATORY_TRACT | 0 refills | Status: AC | PRN
Start: 2021-06-20 — End: ?

## 2021-06-20 MED ORDER — DOXYCYCLINE HYCLATE 100 MG CAPSULE
100.0000 mg | ORAL_CAPSULE | Freq: Two times a day (BID) | ORAL | 0 refills | Status: AC
Start: 2021-06-20 — End: 2021-06-30

## 2021-06-20 NOTE — Progress Notes (Signed)
Attending physician: Dr. Truett Perna   History of Present Illness: Jorge Maddox is a 35 y.o. male who presents to the Urgent Care today with chief complaint of    Chief Complaint            Cough Per pt started before Thanksgiving    Sinus Drainage     Chest Tightness When coughing        Pt presents to [facility] with c/o cough onset ~1 month ago. He associates SOB, sinus drainage, and post-tussive gagging and sts his chest feels tight. Pt reports his sxs are getting worse. He has taken OTC cold medicine for his sxs without improvement. Pt denies fever.     Functional Health Screening:   Patient is under 18: No  Have you had a recent unexplained weight loss or gain?: No  Because we are aware of abuse and domestic violence today, we ask all patients: Are you being hurt, hit, or frightened by anyone at your home or in your life?: No  Do you have any basic needs within your home that are not being met? (such as Food, Shelter, Games developer, Transportation): No  Patient is under 18 and therefore has no Advance Directives: No  Patient has: No Advance  Patient has Advance Directive: No  Patient offered: Scientist, research (medical)         I reviewed and confirmed the patient's past medical history taken by the nurse or medical assistant with the addition of the following:    Past Medical History:    Past Medical History:   Diagnosis Date   . Migraine Headaches 08/19/1997   . Seizures      Past Surgical History:    No past surgical history on file.     Allergies:  Allergies   Allergen Reactions   . Horseradish Anaphylaxis   . Biaxin [Clarithromycin] Nausea/ Vomiting   . Iv Contrast    . Oxycodone Mental Status Effect   . Peanuts [Peanut] Itching     Medications:    Current Outpatient Medications   Medication Sig   . albuterol sulfate (PROAIR HFA) 90 mcg/actuation Inhalation oral inhaler Take 2 Puffs by inhalation Every 4 hours as needed   . Benzonatate (TESSALON) 200 mg Oral Capsule Take 1 Capsule (200 mg total) by mouth  Three times a day as needed for Cough   . doxycycline hyclate (VIBRAMYCIN) 100 mg Oral Capsule Take 1 Capsule (100 mg total) by mouth Twice daily for 10 days   . levETIRAcetam (KEPPRA) 1,000 mg Oral Tablet Take 1,250 mg by mouth Twice daily   . predniSONE (DELTASONE) 20 mg Oral Tablet Take 2 Tablets (40 mg total) by mouth Once a day     Social History:    Social History     Tobacco Use   . Smoking status: Never   . Smokeless tobacco: Never   Substance Use Topics   . Alcohol use: Yes     Alcohol/week: 1.7 standard drinks     Types: 2 Cans of beer per week     Comment: occ   . Drug use: No     Family History:  Family Medical History:    None        Review of Systems:  General: no fever  ENT: sinus drainage  Pulmonary: cough, SOB, post-tussive gagging   All other review of systems were negative    Physical Exam:  Vital signs:   Vitals:    06/20/21 1952  BP: 124/79   Pulse: 90   Resp: 18   Temp: 36.4 C (97.6 F)   TempSrc: Thermal Scan   SpO2: 96%   Weight: 104 kg (230 lb)   Height: 1.854 m ('6\' 1"' )   BMI: 30.41     Body mass index is 30.34 kg/m. Facility age limit for growth percentiles is 20 years.  No LMP for male patient.    General: No acute distress  Eyes:  Normal lids/lashes, normal conjunctiva  ENT:  normal EAC's, normal TM's, MMM, normal pharynx/tonsils, normal tongue/uvula  Pulmonary:  Bronchial cough, wheezes in R base, no rales and no rhonchi  Cardiovascular:  regular rate/rhythm, normal S1/S2, no murmur/rub/gallop  Gastrointestinal:  Non-distended, normal bowel sounds, soft, non-tender, no rebound, no guarding  Skin:  warm/dry and no rash  Psychiatric:  Appropriate affect and behavior and Normal speech  Neurologic:   Alert and oriented x 3  Hem/Lymph:  No cervical lymphadenopathy    Data Reviewed:    Not applicable    Course: Condition at discharge: Good     Differential Diagnosis: URI vs bronchitis vs viral syndrome    Assessment:   1. Bronchitis      Plan:    Orders Placed This Encounter   . doxycycline  hyclate (VIBRAMYCIN) 100 mg Oral Capsule   . albuterol sulfate (PROAIR HFA) 90 mcg/actuation Inhalation oral inhaler   . predniSONE (DELTASONE) 20 mg Oral Tablet   . Benzonatate (TESSALON) 200 mg Oral Capsule     Rx for doxycyline, albuterol inhaler, prednisone, and tessalon sent to pharmacy.   Discussed supportive treatment.   Given work excuse for 2 days.    Go to Emergency Department immediately for further work up if any concerning symptoms.  Plan was discussed and patient verbalized understanding. If symptoms are worsening or not improving the patient should return to the [facility] for further evaluation.    I am scribing for, and in the presence of, Ed Blalock, FNP-BC, for services provided on 06/20/2021.  Lovena Le Berisford, SCRIBE   [place], [place] 06/20/2021, 19:48    I personally performed the services described in this documentation, as scribed  in my presence, and it is both accurate  and complete.    Ed Blalock, APRN,FNP-BC

## 2021-06-28 ENCOUNTER — Emergency Department
Admission: EM | Admit: 2021-06-28 | Discharge: 2021-06-28 | Disposition: A | Discharge status: Home / Self Care | Payer: 59 | Attending: Emergency Medicine | Admitting: Emergency Medicine

## 2021-06-28 ENCOUNTER — Encounter (HOSPITAL_COMMUNITY): Payer: Self-pay

## 2021-06-28 ENCOUNTER — Emergency Department (HOSPITAL_COMMUNITY): Payer: 59

## 2021-06-28 ENCOUNTER — Emergency Department (EMERGENCY_DEPARTMENT_HOSPITAL): Payer: 59 | Admitting: Radiology

## 2021-06-28 ENCOUNTER — Other Ambulatory Visit: Payer: Self-pay

## 2021-06-28 DIAGNOSIS — R059 Cough, unspecified: Secondary | ICD-10-CM

## 2021-06-28 DIAGNOSIS — Z20822 Contact with and (suspected) exposure to covid-19: Secondary | ICD-10-CM | POA: Insufficient documentation

## 2021-06-28 LAB — COVID-19, FLU A/B, RSV RAPID BY PCR
INFLUENZA VIRUS TYPE A: NOT DETECTED
INFLUENZA VIRUS TYPE B: NOT DETECTED
RESPIRATORY SYNCTIAL VIRUS (RSV): NOT DETECTED
SARS-CoV-2: NOT DETECTED

## 2021-06-28 MED ORDER — IPRATROPIUM 0.5 MG-ALBUTEROL 3 MG (2.5 MG BASE)/3 ML NEBULIZATION SOLN
3.0000 mL | INHALATION_SOLUTION | RESPIRATORY_TRACT | Status: AC
Start: 2021-06-28 — End: 2021-06-28
  Administered 2021-06-28: 3 mL via RESPIRATORY_TRACT
  Filled 2021-06-28: qty 3

## 2021-06-28 NOTE — ED Triage Notes (Signed)
Pt treated a week ago at Urgent Care for bronchitis and states that it is getting worse. He states that 2 days left on antibiotics.

## 2021-06-28 NOTE — ED Provider Notes (Signed)
CC:  Cough    HPI:  Jorge Maddox is a 35 y.o. male that presents due to cough and shortness of breath.  Patient states for the past 5 weeks he has had a increasing cough.  Patient was seen a few days ago at an urgent care and started on doxycycline for concern for bronchitis however his symptoms have not improved.  Patient states he has been coughing up a lot of phlegm.  No known fevers.  No recent contacts that have been sick.    ROS: Except as in HPI  Constitutional: No fever or weakness   Skin: No rash or diaphoresis  HENT: No headaches or congestion  Eyes: No vision changes   Cardio: No chest pain, palpitations or leg swelling   Respiratory: No cough, wheezing or SOB  GI:  No nausea, vomiting, diarrhea, constipation  GU:  No dysuria, hematuria, polyuria  MSK: No joint or back pain  Neuro: No loss of sensation, confusion, focal deficits  Psychiatric: No mood changes  All other systems reviewed and are negative.    Medications:  Medications Prior to Admission     Prescriptions    albuterol sulfate (PROAIR HFA) 90 mcg/actuation Inhalation oral inhaler    Take 2 Puffs by inhalation Every 4 hours as needed    Benzonatate (TESSALON) 200 mg Oral Capsule    Take 1 Capsule (200 mg total) by mouth Three times a day as needed for Cough    doxycycline hyclate (VIBRAMYCIN) 100 mg Oral Capsule    Take 1 Capsule (100 mg total) by mouth Twice daily for 10 days    levETIRAcetam (KEPPRA) 1,000 mg Oral Tablet    Take 1,250 mg by mouth Twice daily    predniSONE (DELTASONE) 20 mg Oral Tablet    Take 2 Tablets (40 mg total) by mouth Once a day          Allergies:  Allergies   Allergen Reactions    Horseradish Anaphylaxis    Biaxin [Clarithromycin] Nausea/ Vomiting    Iv Contrast     Oxycodone Mental Status Effect    Peanuts [Peanut] Itching     Allergies reviewed with patient    Past Medical History:   Diagnosis Date    Migraine Headaches 08/19/1997    Seizures          History reviewed with patient            Social  History     Socioeconomic History    Marital status: Married     Spouse name: Not on file    Number of children: Not on file    Years of education: Not on file    Highest education level: Not on file   Occupational History    Not on file   Tobacco Use    Smoking status: Never    Smokeless tobacco: Never   Substance and Sexual Activity    Alcohol use: Yes     Alcohol/week: 1.7 standard drinks     Types: 2 Cans of beer per week     Comment: occ    Drug use: No    Sexual activity: Not on file   Other Topics Concern    Not on file   Social History Narrative    Not on file     Social Determinants of Health     Financial Resource Strain: Not on file   Food Insecurity: Not on file   Transportation Needs: Not on file  Physical Activity: Not on file   Stress: Not on file   Intimate Partner Violence: Not on file   Housing Stability: Not on file       Family Medical History:    None           Physical Exam:  All nurse's notes reviewed.  ED Triage Vitals [06/28/21 2205]   BP (Non-Invasive) (!) 137/92   Heart Rate 85   Respiratory Rate 20   Temperature 36.1 C (97 F)   SpO2 97 %   Weight 104 kg (230 lb)   Height 1.854 m (6\' 1" )     Constitutional: NAD. Oriented x4  HENT:   Head: Normocephalic and atraumatic.   Mouth/Throat: Oropharynx is clear and moist.   Eyes: PERRL, EOMI, conjunctivae without discharge bilaterally  Neck: Trachea midline.   Cardiovascular: RRR, No murmurs, rubs or gallops.   Pulmonary/Chest: BS equal bilaterally, good air movement. No respiratory distress. No wheezes, rales or chest tenderness.   Abdominal: BS +. Abdomen soft, no tenderness, rebound or guarding.               Musculoskeletal: No edema, tenderness or deformity.  Skin: warm and dry. No rash, erythema, pallor or cyanosis  Psychiatric: normal mood and affect. Behavior is normal.   Neurological: Alert&Ox3. Grossly intact.            Labs:  Results for orders placed or performed during the hospital encounter of 06/28/21 (from the past  24 hour(s))   COVID-19, FLU A/B, RSV RAPID BY PCR   Result Value Ref Range    SARS-CoV-2 Not Detected Not Detected    INFLUENZA VIRUS TYPE A Not Detected Not Detected    INFLUENZA VIRUS TYPE B Not Detected Not Detected    RESPIRATORY SYNCTIAL VIRUS (RSV) Not Detected Not Detected    Narrative    Results are for the simultaneous qualitative identification of SARS-CoV-2 (formerly 2019-nCoV), Influenza A, Influenza B, and RSV RNA. These etiologic agents are generally detectable in nasopharyngeal and nasal swabs during the ACUTE PHASE of infection. Hence, this test is intended to be performed on respiratory specimens collected from individuals with signs and symptoms of upper respiratory tract infection who meet Centers for Disease Control and Prevention (CDC) clinical and/or epidemiological criteria for Coronavirus Disease 2019 (COVID-19) testing. CDC COVID-19 criteria for testing on human specimens is available at [facility] webpage information for Healthcare Professionals: Coronavirus Disease 2019 (COVID-19) ([employer]).     False-negative results may occur if the virus has genomic mutations, insertions, deletions, or rearrangements or if performed very early in the course of illness. Otherwise, negative results indicate virus specific RNA targets are not detected, however negative results do not preclude SARS-CoV-2 infection/COVID-19, Influenza, or Respiratory syncytial virus infection. Results should not be used as the sole basis for patient management decisions. Negative results must be combined with clinical observations, patient history, and epidemiological information. If upper respiratory tract infection is still suspected based on exposure history together with other clinical findings, re-testing should be considered.    Disclaimer:   This assay has been authorized by FDA under an Emergency Use Authorization for use in laboratories certified under the Clinical  Laboratory Improvement Amendments of 1988 (CLIA), 42 U.S.C. [phone], to perform high complexity tests. The impacts of vaccines, antiviral therapeutics, antibiotics, chemotherapeutic or immunosuppressant drugs have not been evaluated.     Test methodology:   Cepheid Xpert Xpress SARS-CoV-2/Flu/RSV Assay real-time polymerase chain reaction (RT-PCR) test on the GeneXpert Dx and Xpert  Xpress systems.       Imaging:    Results for orders placed or performed during the hospital encounter of 06/28/21 (from the past 72 hour(s))   MOBILE CHEST X-RAY     Status: None    Narrative    Patience Musca Maddox  Male, 35 years old.    XR AP MOBILE CHEST performed on 06/28/2021 10:31 PM.    REASON FOR EXAM:  cough    TECHNIQUE: 1 views/1 images submitted for interpretation.    COMPARISON:  None available    FINDINGS:  The heart is normal in size. There is slight asymmetric elevation of the right hemidiaphragm. No focal consolidation, pleural effusion, or pneumothorax is identified. Included portions of the upper abdomen are unremarkable.      Impression    No acute cardiopulmonary process.           Orders Placed This Encounter    MOBILE CHEST X-RAY    COVID-19, FLU A/B, RSV RAPID BY PCR    ipratropium-albuterol 0.5 mg-3 mg(2.5 mg base)/3 mL Solution for Nebulization       Jearld Fenton, MD 06/28/2021, 23:20  Plan: Appropriate labs and imaging ordered. Medical Records reviewed.    Therapy/Procedures/Course/MDM:   -Patient was vitally stable throughout visit.    -Results were discussed with patient. They were given an opportunity to ask questions.  -normal oxygen saturation on room air  -no wheezing on exam, little improvement with breathing treatment with patient's symptoms  -no acute process seen on chest x-ray  -patient has cough and congestion here in the emergency department and symptoms sound like an upper respiratory infection  -patient will follow-up with his primary care provider and discuss possible pulmonary function  testing  -patient states his symptoms have been intermittently occurring for the last 5 weeks but have always included some cough and congestion.  Patient states that he has been coughing up large amounts of phlegm.  Consults:  None  Impression:  Cough  Disposition:      Following the above history, physical exam, and studies, the patient was deemed stable and suitable for discharge and he will follow up as needed . Patient was advised to return to the ED with any new, concerning or worsening symptoms and follow up as directed. The patient verbalized understanding of all instructions and had no further questions or concerns.

## 2021-06-28 NOTE — ED Nurses Note (Addendum)
Patient discharged home with family.  AVS reviewed with patient/care giver.  A written copy of the AVS and discharge instructions was given to the patient/care giver.  Questions sufficiently answered as needed.  Patient/care giver encouraged to follow up with PCP as indicated.  In the event of an emergency, patient/care giver instructed to call 911 or go to the nearest emergency room.     Patient declined wheelchair by RN at discharge.     Zamariyah Furukawa, RN  06/28/2021, 23:25

## 2021-06-28 NOTE — Discharge Instructions (Signed)
Please return immediatly to the nearest ER with any new or concerning symptoms. Follow up with your primary care provider as soon as possible. Thank you for letting me be a part of your treatment team today at [place] [facility].

## 2021-08-27 ENCOUNTER — Ambulatory Visit (INDEPENDENT_AMBULATORY_CARE_PROVIDER_SITE_OTHER): Payer: Commercial Managed Care - PPO

## 2021-08-27 ENCOUNTER — Other Ambulatory Visit: Payer: Self-pay

## 2021-08-27 ENCOUNTER — Encounter (INDEPENDENT_AMBULATORY_CARE_PROVIDER_SITE_OTHER): Payer: Self-pay

## 2021-08-27 ENCOUNTER — Other Ambulatory Visit (INDEPENDENT_AMBULATORY_CARE_PROVIDER_SITE_OTHER): Payer: Commercial Managed Care - PPO

## 2021-08-27 VITALS — BP 129/85 | HR 80 | Temp 97.6°F | Ht 73.0 in | Wt 215.5 lb

## 2021-08-27 DIAGNOSIS — W19XXXA Unspecified fall, initial encounter: Secondary | ICD-10-CM

## 2021-08-27 DIAGNOSIS — Y990 Civilian activity done for income or pay: Secondary | ICD-10-CM

## 2021-08-27 DIAGNOSIS — S76012D Strain of muscle, fascia and tendon of left hip, subsequent encounter: Secondary | ICD-10-CM

## 2021-08-27 DIAGNOSIS — S76912D Strain of unspecified muscles, fascia and tendons at thigh level, left thigh, subsequent encounter: Secondary | ICD-10-CM

## 2021-08-27 DIAGNOSIS — S73102A Unspecified sprain of left hip, initial encounter: Secondary | ICD-10-CM

## 2021-08-27 DIAGNOSIS — S73109A Unspecified sprain of unspecified hip, initial encounter: Secondary | ICD-10-CM

## 2021-08-27 NOTE — Progress Notes (Signed)
Attending physician: Dr. Charissa Bash  History of Present Illness: Jorge Maddox is a 36 y.o. male who presents to the Urgent Care today with chief complaint of    Chief Complaint            Hip Pain Left hip started yesterday         Pt presents to [facility] with c/o L hip pain onset yesterday. Pt also reports a slow thought process. 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 He sts when he got home from work yesterday he slept from 9am to 4pm and then went back to bed at 8pm and slept until 5pm this evening. Pt sts when he woke up his thought process was slower than usual and he felt foggy. He sts he has slept for that length of time in the past from being overworks and typically felt foggy after waking from that much sleep. Pt also admits to a seizure disorder and hypothesizes that he had a seizure in his sleep because he has felt this way postictally. He sts he follows with neurology and has seizure medications that he is taking as prescribed. Pt sts he has also been taking the maximum doses of NSAIDs and tylenol for pain in his L arm and tailbone that results from overuse at work. He denies fever.    Functional Health Screening:   Patient is under 18: No  Have you had a recent unexplained weight loss or gain?: No  Because we are aware of abuse and domestic violence today, we ask all patients: Are you being hurt, hit, or frightened by anyone at your home or in your life?: No  Do you have any basic needs within your home that are not being met? (such as Food, Shelter, Games developer, Transportation): No  Patient is under 18 and therefore has no Advance Directives: No  Patient has: No Advance  Patient has Advance Directive: No  Patient offered:  Scientist, research (medical)         I reviewed and confirmed the patient's past medical history taken by the nurse or medical assistant with the addition of the following:    Past Medical History:    Past Medical History:   Diagnosis Date   . Migraine Headaches 08/19/1997   . Seizures      Past Surgical History:    History reviewed. No pertinent surgical history.     Allergies:  Allergies   Allergen Reactions   . Horseradish Anaphylaxis   . Biaxin [Clarithromycin] Nausea/ Vomiting   . Iv Contrast    . Oxycodone Mental Status Effect   . Peanuts [Peanut] Itching     Medications:    Current Outpatient Medications   Medication Sig   . albuterol sulfate (PROAIR HFA) 90 mcg/actuation Inhalation oral inhaler Take 2 Puffs by inhalation Every 4 hours as needed   . Benzonatate (TESSALON) 200 mg Oral Capsule Take 1 Capsule (200 mg total) by mouth Three times a day as needed for Cough (Patient not taking: Reported on 08/27/2021)   . levETIRAcetam (KEPPRA) 1,000 mg Oral Tablet Take 1,250 mg by mouth Twice daily   . predniSONE (DELTASONE) 20 mg Oral Tablet Take  2 Tablets (40 mg total) by mouth Once a day (Patient not taking: Reported on 08/27/2021)     Social History:    Social History     Tobacco Use   . Smoking status: Never   . Smokeless tobacco: Never   Substance Use Topics   . Alcohol use: Yes     Alcohol/week: 1.7 standard drinks     Types: 2 Cans of beer per week     Comment: occ   . Drug use: No     Family History:  Family Medical History:    None       Review of Systems:  General: no fever  Musculoskeletal: L hip pain  Neurologic: slow thought process, fogginess  All other review of systems were negative    Physical Exam:  Vital signs:   Vitals:    08/27/21 1820   BP: 129/85   Pulse: 80   Temp: 36.4 C (97.6 F)   TempSrc: Temporal   SpO2: 97%   Weight: 97.8 kg (215 lb 8 oz)   Height: 1.854 m ('6\' 1"' )   BMI: 28.49     Body mass index is 28.43 kg/m. Facility age limit for growth percentiles is 20 years.  No LMP for male  patient.    General: No acute distress  Eyes:  Normal lids/lashes, normal conjunctiva  ENT:  normal EAC's, normal TM's, MMM, normal pharynx/tonsils, normal tongue/uvula  Pulmonary:  clear to auscultation bilaterally, no wheezes, no rales and no rhonchi  Cardiovascular:  regular rate/rhythm, normal S1/S2, no murmur/rub/gallop  Skin:  warm/dry and no rash  Musculoskeletal: tenderness over L inguinal ligament, walks with a limp, legs are equal length, 3/5 strength on L leg, limited d/t pain, positive CMS distal to hip  Psychiatric:  Appropriate affect and behavior and Normal speech  Neurologic:   Alert and oriented x 3, speech clear and conversation appropriate  Hem/Lymph:  No cervical lymphadenopathy    Data Reviewed:    Radiography: 08/27/21 XR Low Pelvis w L Lateral Hip as reviewed by Angelica Ran, APRN, FNP-BC at [place]: No acute osseous abnormality.     Course: Condition at discharge: Good     Differential Diagnosis: strain vs sprain vs fracture                                       Fatigue vs viral illness vs postictal state vs other neurological abnormality    Assessment:   1. Hip sprain      Plan:    Orders Placed This Encounter   . XR LOW PELVIS W LEFT LATERAL HIP     Discussed ibuprofen 872m TID, advised he can take tylenol in addition to that.   Recommended ice and rest.   Discussed that if the fogginess does not clear by morning, he should go to the ED for further evaluation as he may require some lab work and a CT of his head.   Pt is agreeable with this plan.     Go to Emergency Department immediately for further work up if any concerning symptoms.  Plan was discussed and patient verbalized understanding. If symptoms are worsening or not improving [facility].    I am scribing for, and in the presence of, SEd Blalock FNP-BC, for services provided on 08/27/2021.  TLovena LeBerisford, SCRIBE   [employer]  [place], [place] 08/27/2021,  18:22    I personally performed the services described in this documentation, as scribed  in my presence, and it is both accurate  and complete.    Ed Blalock, APRN,FNP-BC

## 2022-03-19 ENCOUNTER — Ambulatory Visit (INDEPENDENT_AMBULATORY_CARE_PROVIDER_SITE_OTHER): Payer: 59 | Admitting: Family

## 2022-03-19 ENCOUNTER — Encounter (INDEPENDENT_AMBULATORY_CARE_PROVIDER_SITE_OTHER): Payer: Self-pay

## 2022-03-19 ENCOUNTER — Other Ambulatory Visit: Payer: Self-pay

## 2022-03-19 VITALS — BP 125/85 | HR 69 | Temp 98.5°F | Resp 18 | Ht 73.0 in | Wt 241.0 lb

## 2022-03-19 DIAGNOSIS — F419 Anxiety disorder, unspecified: Secondary | ICD-10-CM

## 2022-03-19 NOTE — Progress Notes (Signed)
FAMILY MEDICINE, [facility] MEDICAL  [address]  [place] [address]       Name: Jorge Maddox MRN:  [id number]   Date: 03/19/2022 Age: 36 y.o.   12/07/1985         10:50 AM EDT    CHIEF COMPLAINT:  Chief Complaint              Mental Health Counseling Referral              Subjective:       HISTORY OF PRESENT ILLNESS:    Jorge Maddox comes in today for a work excuse, as he wants to go to the football game tonight, and feels, that he would not be able to go, as his job requires him to work overtime hours.  Additionally, he is very stressed, as he works a lot of hours at his job, and is requesting mental health counseling.  He is not interested in medication at this time.  Normally follows with a different primary care, this is the 1st time for him in this office.  Denies SI/HI.    REVIEW OF SYSTEMS:  Review of Systems   Psychiatric/Behavioral:  The patient is nervous/anxious.    All other systems reviewed and are negative.     Current Outpatient Medications   Medication Sig    albuterol sulfate (PROAIR HFA) 90 mcg/actuation Inhalation oral inhaler Take 2 Puffs by inhalation Every 4 hours as needed (Patient not taking: Reported on 03/19/2022)    Benzonatate (TESSALON) 200 mg Oral Capsule Take 1 Capsule (200 mg total) by mouth Three times a day as needed for Cough (Patient not taking: Reported on 08/27/2021)    levETIRAcetam (KEPPRA) 1,000 mg Oral Tablet Take 1,250 mg by mouth Twice daily    predniSONE (DELTASONE) 20 mg Oral Tablet Take 2 Tablets (40 mg total) by mouth Once a day (Patient not taking: Reported on 08/27/2021)       Vitals:    03/19/22 1056   BP: 125/85   Pulse: 69   Resp: 18   Temp: 36.9 C (98.5 F)   TempSrc: Thermal Scan   SpO2: 96%   Weight: 109 kg (241 lb)   Height: 1.854 m (6\' 1" )   BMI: 31.86         Body mass index is 31.8 kg/m.     Allergies   Allergen Reactions    Horseradish Anaphylaxis    Biaxin [Clarithromycin] Nausea/ Vomiting    Iv Contrast     Oxycodone Mental Status  Effect    Peanuts [Peanut] Itching     Past Medical History:   Diagnosis Date    Migraine Headaches 08/19/1997    Seizures            Family Medical History:    None         Social History     Socioeconomic History    Marital status: Married   Tobacco Use    Smoking status: Never    Smokeless tobacco: Never   Substance and Sexual Activity    Alcohol use: Yes     Alcohol/week: 1.7 standard drinks     Types: 2 Cans of beer per week     Comment: occ    Drug use: No       Past Medical, Social, Family and Surgical History as reviewed by office staff and as noted in chart.   Allergies reviewed by staff and as noted in chart.  Objective:       PHYSICAL EXAMINATION:  Physical Exam  Vitals and nursing note reviewed.   Constitutional:       Appearance: Normal appearance. He is normal weight.   HENT:      Head: Normocephalic and atraumatic.      Right Ear: Tympanic membrane, ear canal and external ear normal.      Left Ear: Tympanic membrane, ear canal and external ear normal.      Nose: Nose normal.      Mouth/Throat:      Mouth: Mucous membranes are moist.   Eyes:      Pupils: Pupils are equal, round, and reactive to light.   Cardiovascular:      Rate and Rhythm: Normal rate and regular rhythm.      Pulses: Normal pulses.      Heart sounds: Normal heart sounds.   Pulmonary:      Effort: Pulmonary effort is normal.      Breath sounds: Normal breath sounds.   Abdominal:      General: Abdomen is flat. Bowel sounds are normal.   Musculoskeletal:         General: Normal range of motion.      Cervical back: Normal range of motion.   Skin:     General: Skin is warm.   Neurological:      General: No focal deficit present.      Mental Status: He is alert and oriented to person, place, and time.   Psychiatric:         Mood and Affect: Mood normal.         Behavior: Behavior normal.         Thought Content: Thought content normal.         Judgment: Judgment normal.      Imaging  No results found.            Labs       There are no  exam notes on file for this visit.    Problem List:  Problem List Items Addressed This Visit    None  Visit Diagnoses       Anxiety    -  Primary    Relevant Orders    Refer to External Provider               ASSESSMENT AND PLAN:     Will provide with work excuse, for today, as well as tomorrow.  Additionally, will place referral for counseling.  Return office as needed.  He was understanding.    ENCOUNTER DIAGNOSES     ICD-10-CM   1. Anxiety  F41.9       ICD-10-CM    1. Anxiety  F41.9 Refer to External Provider          No visits with results within 3 Month(s) from this visit.   Latest known visit with results is:   Admission on 06/28/2021, Discharged on 06/28/2021   Component Date Value Ref Range Status    SARS-CoV-2 06/28/2021 Not Detected  Not Detected Final    INFLUENZA VIRUS TYPE A 06/28/2021 Not Detected  Not Detected Final    INFLUENZA VIRUS TYPE B 06/28/2021 Not Detected  Not Detected Final    RESPIRATORY SYNCTIAL VIRUS (RSV) 06/28/2021 Not Detected  Not Detected Final           The PHQ 2 Total: 2 depression screen is interpreted as positive and the follow up plan is Suicide risk assessment  completed and patient  is not suicidal.      Orders Placed This Encounter    Refer to External Provider         The patient was given ample opportunity to ask questions and those questions were answered to the patient's satisfaction. I discussed lab work ordered , and the patient agrees to possible add-on labs, as determined through initial blood work. The patient was encouraged to be involved in their own care, and all diagnoses, medications, and medication side-effects were discussed.  A copy of the patient's medication list was printed and given to the patient. A good faith effort was made to reconcile the patient's medications.  Discussed with patient effects and side effects of medications. Medication safety was discussed. The patient was told to contact me with any additional questions or concerns, or go to the ED  in an emergency. I instructed the patient to use [facility] for messages, or call the office. A follow up in no more than 2 days, if no improvement, was discussed, and clinic hours were made available to the patient.    ________________________________________  Noah Delaine, APRN,FNP-BC   03/19/2022, 11:45    Portions of this note may be dictated using MModal Fluency. Parts of this patient's chart may be completed in a retrospective fashion due to simultaneous direct patient care activities.  Variances in spelling and vocabulary are possible and unintentional. Not all errors are caught/corrected. Please notify the Thereasa Parkin if any discrepancies are noted or if the meaning of any statement is not clear.

## 2022-03-21 ENCOUNTER — Telehealth (INDEPENDENT_AMBULATORY_CARE_PROVIDER_SITE_OTHER): Payer: Self-pay | Admitting: Family

## 2022-03-21 NOTE — Telephone Encounter (Signed)
REFERRAL WAS FAXED TO [facility] AND THEIR OFFICE WILL CONTACT PATIENT WITH APPT.  03/21/22 BB 406 P.M.

## 2022-04-05 ENCOUNTER — Other Ambulatory Visit (HOSPITAL_COMMUNITY): Payer: Self-pay | Admitting: Neurology

## 2022-04-05 DIAGNOSIS — R270 Ataxia, unspecified: Secondary | ICD-10-CM

## 2022-04-18 ENCOUNTER — Inpatient Hospital Stay
Admission: RE | Admit: 2022-04-18 | Discharge: 2022-04-18 | Disposition: A | Discharge status: Home / Self Care | Payer: 59 | Source: Ambulatory Visit | Attending: Neurology | Admitting: Neurology

## 2022-04-18 ENCOUNTER — Other Ambulatory Visit: Payer: Self-pay

## 2022-04-18 DIAGNOSIS — R270 Ataxia, unspecified: Secondary | ICD-10-CM | POA: Insufficient documentation

## 2023-01-19 ENCOUNTER — Other Ambulatory Visit (HOSPITAL_COMMUNITY): Payer: Self-pay | Admitting: PEDIATRIC MEDICINE

## 2023-01-19 DIAGNOSIS — R079 Chest pain, unspecified: Secondary | ICD-10-CM

## 2023-02-07 ENCOUNTER — Other Ambulatory Visit: Payer: Self-pay

## 2023-02-07 ENCOUNTER — Emergency Department (HOSPITAL_COMMUNITY): Payer: 59

## 2023-02-07 ENCOUNTER — Emergency Department
Admission: EM | Admit: 2023-02-07 | Discharge: 2023-02-08 | Disposition: A | Discharge status: Home / Self Care | Payer: 59 | Attending: Emergency Medicine | Admitting: Emergency Medicine

## 2023-02-07 DIAGNOSIS — R079 Chest pain, unspecified: Secondary | ICD-10-CM | POA: Insufficient documentation

## 2023-02-07 MED ORDER — SODIUM CHLORIDE 0.9 % (FLUSH) INJECTION SYRINGE
2.0000 mL | INJECTION | INTRAMUSCULAR | Status: DC | PRN
Start: 2023-02-07 — End: 2023-02-08

## 2023-02-07 MED ORDER — SODIUM CHLORIDE 0.9 % (FLUSH) INJECTION SYRINGE
2.0000 mL | INJECTION | Freq: Three times a day (TID) | INTRAMUSCULAR | Status: DC
Start: 2023-02-08 — End: 2023-02-08
  Administered 2023-02-08: 5 mL

## 2023-02-07 MED ORDER — SODIUM CHLORIDE 0.9% FLUSH BAG - 250 ML
INTRAVENOUS | Status: DC | PRN
Start: 2023-02-07 — End: 2023-02-08

## 2023-02-07 MED ORDER — DEXTROSE 5% IN WATER (D5W) FLUSH BAG - 250 ML
INTRAVENOUS | Status: DC | PRN
Start: 2023-02-07 — End: 2023-02-08

## 2023-02-07 NOTE — ED Attending Note (Signed)
The "actual" encounter date is 02/07/2023, as generated by Epic system encounter date stamp.     Note begun by: Ezzie Dural, MD    I was physically present and directly supervised this patient's care.  Patient was seen and examined.  The resident/NP/PA's history and exam were reviewed.  Key elements in addition to and/or correction of that documentation are as follows:  Vitals  Blood pressure (!) 140/93, pulse 81, temperature 36.5 C (97.7 F), resp. rate 18, height 1.829 m (6'), weight 111 kg (245 lb 6 oz), SpO2 96%..  This is a 37 y.o. male.   Co chest pain      Old records were reviewed.      Medical Decision Making  Risk  Prescription drug management.        Disposition: Discharged    Clinical Impression:     Clinical Impression   Chest pain, unspecified type (Primary)       Critical Care:  None    This note was partially generated using MModal Fluency Direct system, and there may be some incorrect words, spellings, and punctuation that were not noted in checking the note before saving.    Some of the documentation has been completed after the conclusion of patient care due to bedside patient care needs and the acuity of the department.

## 2023-02-08 ENCOUNTER — Emergency Department (HOSPITAL_COMMUNITY): Payer: 59

## 2023-02-08 ENCOUNTER — Encounter (HOSPITAL_COMMUNITY): Payer: Self-pay

## 2023-02-08 ENCOUNTER — Other Ambulatory Visit: Payer: Self-pay

## 2023-02-08 DIAGNOSIS — Z136 Encounter for screening for cardiovascular disorders: Secondary | ICD-10-CM

## 2023-02-08 DIAGNOSIS — R079 Chest pain, unspecified: Secondary | ICD-10-CM

## 2023-02-08 LAB — CBC WITH DIFF
BASOPHIL #: <0.1 10*3/uL (ref <=0.20)
BASOPHIL %: 0.5 %
EOSINOPHIL #: 0.62 10*3/uL — ABNORMAL HIGH (ref <=0.50)
EOSINOPHIL %: 7.7 %
HCT: 47.5 % (ref 38.9–52.0)
HGB: 16.1 g/dL (ref 13.4–17.5)
IMMATURE GRANULOCYTE #: <0.1 10*3/uL (ref <0.10)
IMMATURE GRANULOCYTE %: 0.2 % (ref 0.0–1.0)
LYMPHOCYTE #: 2.1 10*3/uL (ref 1.00–4.80)
LYMPHOCYTE %: 26 %
MCH: 30.8 pg (ref 26.0–32.0)
MCHC: 33.9 g/dL (ref 31.0–35.5)
MCV: 90.8 fL (ref 78.0–100.0)
MONOCYTE #: 0.65 10*3/uL (ref 0.20–1.10)
MONOCYTE %: 8 %
MPV: 11 fL (ref 8.7–12.5)
NEUTROPHIL #: 4.65 10*3/uL (ref 1.50–7.70)
NEUTROPHIL %: 57.6 %
PLATELETS: 245 10*3/uL (ref 150–400)
RBC: 5.23 10*6/uL (ref 4.50–6.10)
RDW-CV: 12.3 % (ref 11.5–15.5)
WBC: 8.1 10*3/uL (ref 3.7–11.0)

## 2023-02-08 LAB — LYME ANTIBODY PANEL WITH REFLEX: LYME ANTIBODY TOTAL (Screen): NEGATIVE

## 2023-02-08 LAB — ECG 12-LEAD
Atrial Rate: 68 {beats}/min
Calculated P Axis: 63 degrees
Calculated R Axis: 78 degrees
Calculated T Axis: 47 degrees
PR Interval: 138 ms
QRS Duration: 94 ms
QT Interval: 368 ms
QTC Calculation: 391 ms
Ventricular rate: 68 {beats}/min

## 2023-02-08 LAB — BASIC METABOLIC PANEL
ANION GAP: 9 mmol/L (ref 4–13)
BUN/CREA RATIO: 12 (ref 6–22)
BUN: 12 mg/dL (ref 8–25)
CALCIUM: 9.5 mg/dL (ref 8.6–10.2)
CHLORIDE: 105 mmol/L (ref 96–111)
CO2 TOTAL: 23 mmol/L (ref 22–30)
CREATININE: 1.04 mg/dL (ref 0.75–1.35)
ESTIMATED GFR - MALE: >90 mL/min/BSA (ref >=60)
GLUCOSE: 102 mg/dL (ref 65–125)
POTASSIUM: 4 mmol/L (ref 3.5–5.1)
SODIUM: 137 mmol/L (ref 136–145)

## 2023-02-08 LAB — HIV1/HIV2 SCREEN, COMBINED ANTIGEN AND ANTIBODY: HIV SCREEN, COMBINED ANTIGEN & ANTIBODY: NEGATIVE

## 2023-02-08 LAB — TROPONIN-I (FOR ED ONLY): TROPONIN-I HS: <2.7 ng/L (ref <=35.0)

## 2023-02-08 LAB — HEPATITIS C ANTIBODY SCREEN WITH REFLEX TO HCV PCR: HCV ANTIBODY QUALITATIVE: NEGATIVE

## 2023-02-08 MED ORDER — GI COCKTAIL (ANTACID SUSP, LIDOCAINE)
15.0000 mL | Status: AC
Start: 2023-02-08 — End: 2023-02-08
  Administered 2023-02-08: 15 mL via ORAL
  Filled 2023-02-08: qty 15

## 2023-02-08 MED ORDER — PANTOPRAZOLE 40 MG TABLET,DELAYED RELEASE
40.0000 mg | DELAYED_RELEASE_TABLET | Freq: Every day | ORAL | 0 refills | Status: AC
Start: 2023-02-08 — End: 2023-03-10
  Filled 2023-02-08: qty 30, 30d supply, fill #0

## 2023-02-08 MED ORDER — FAMOTIDINE (PF) 20 MG/2 ML INTRAVENOUS SOLUTION
20.0000 mg | INTRAVENOUS | Status: AC
Start: 2023-02-08 — End: 2023-02-08
  Administered 2023-02-08: 20 mg via INTRAVENOUS
  Filled 2023-02-08: qty 2

## 2023-02-08 NOTE — ED Nurses Note (Addendum)
Patient presents to ED with complaints of chest pain. Complains of lower chest pain that he describes as a squeezing pressure x 6 days. Patient states the pain worsens when laying down and is improved with sitting or standing. Patient reports being stung by a red wasp on his right arm in [place] 11 days ago and is unsure if it is related. Patient reports a history of pain "around but never on my heart". Patient endorses slight SOB when waking up and occasional tingling in right arm.   Joni Reining, Charity fundraiser

## 2023-02-08 NOTE — ED Nurses Note (Signed)
PIV removed, catheter intact, pressure held. Discharge instructions given, pt verbalized understanding and had no questions at this time.

## 2023-02-08 NOTE — ED Nurses Note (Signed)
Patient resting in bed comfortably at this time with no voiced complaints. Family at bedside.   Joni Reining, Charity fundraiser

## 2023-02-08 NOTE — Discharge Instructions (Signed)
Follow up with PCP. Recommend talking with your PCP about stress testing. Return with any new or worsening symptoms.

## 2023-02-08 NOTE — ED Provider Notes (Signed)
[facility] - Emergency Department  ED Primary Provider Note  History of Present Illness   Chief Complaint   Patient presents with    Chest Pain      Onset x5 days ago, lower chest pressure worse when lying flat, when waking up feels like there are bricks laying on his chest, endorses shortness of breath at times, denies blood thinner use or known cardiac history     Jorge Maddox is a 37 y.o. male who had concerns including Chest Pain .  Arrival: The patient arrived by Car    HPI  37 year old male with no significant past medical history presenting with chest pain.  Patient reports 5 days of chest like pressure in his lower chest.  Symptoms worse whenever he lies flat or when he 1st wakes up.  Associated shortness of breath.  Patient denies prior cardiac history.  Has followed up with PCP who had ordered an echo but was canceled.  Patient denies fever/chills, recent illness, smoking history, history of diabetes, prior blood clots, calf pain/tenderness.  Tick exposure couple of months ago, patient was not treated at that time.    Physical Exam   ED Triage Vitals   BP (Non-Invasive) 02/07/23 2333 (!) 140/93   Heart Rate 02/07/23 2333 81   Respiratory Rate 02/07/23 2333 18   Temperature 02/07/23 2333 36.5 C (97.7 F)   SpO2 02/07/23 2333 96 %   Weight 02/07/23 2333 111 kg (245 lb 6 oz)   Height 02/07/23 2333 1.829 m (6')     Physical Exam  Constitutional:       General: He is not in acute distress.     Appearance: He is not toxic-appearing.   Eyes:      Extraocular Movements: Extraocular movements intact.      Conjunctiva/sclera: Conjunctivae normal.   Cardiovascular:      Rate and Rhythm: Normal rate and regular rhythm.      Pulses: Normal pulses.      Heart sounds: Normal heart sounds.   Pulmonary:      Effort: Pulmonary effort is normal.      Breath sounds: Normal breath sounds.   Abdominal:      General: Abdomen is flat. Bowel sounds are normal. There is no distension.      Palpations:  Abdomen is soft.      Tenderness: There is no abdominal tenderness.   Musculoskeletal:         General: Normal range of motion.      Cervical back: Normal range of motion.   Skin:     General: Skin is warm and dry.      Capillary Refill: Capillary refill takes less than 2 seconds.   Neurological:      General: No focal deficit present.      Mental Status: He is alert and oriented to person, place, and time.       Patient Data     Labs Ordered/Reviewed   CBC WITH DIFF - Abnormal; Notable for the following components:       Result Value    EOSINOPHIL # 0.62 (*)     All other components within normal limits   BASIC METABOLIC PANEL - Normal   TROPONIN-I (FOR ED ONLY) - Normal   HEPATITIS C ANTIBODY SCREEN WITH REFLEX TO HCV PCR - Normal   HIV1/HIV2 SCREEN, COMBINED ANTIGEN AND ANTIBODY - Normal   CBC/DIFF    Narrative:     The following  orders were created for panel order CBC/DIFF.  Procedure                               Abnormality         Status                     ---------                               -----------         ------                     CBC WITH IHKV[[id number]]                Abnormal            Final result                 Please view results for these tests on the individual orders.     XR AP MOBILE CHEST   Final Result by Edi, Radresults In (07/31 5643)   No acute cardiopulmonary process.        Medical Decision Making        Medical Decision Making  Amount and/or Complexity of Data Reviewed  Independent Historian: spouse  Labs: ordered. Decision-making details documented in ED Course.  Radiology: ordered and independent interpretation performed. Decision-making details documented in ED Course.  ECG/medicine tests: ordered and independent interpretation performed. Decision-making details documented in ED Course.    Risk  Prescription drug management.  Decision regarding hospitalization.    37 year old male with no significant prior history presenting with lower chest pressure that started 5 days ago.   Patient reports that symptoms are worse when he lies flat and when he wakes up.  Describes sensation as "risks lying on his chest" with associated shortness of breath.  Denies known cardiac history.  Patient reports that symptoms have been persistent and radiates from epigastric region midsternal region.  Patient reports that he has followed with his PCP and was supposed to have an echo but was canceled.  Denies fever/chills, vomiting, diarrhea, urinary symptoms, flank pain, pleuritic chest pain, prior history of blood clots.  Patient reports that he was bit by a tick a few months ago and did not receive treatment at that time.  Reports that tick was attached for more than 24 hours.  Denies recent illness.  On evaluation, vitals stable and afebrile.  Nontoxic appearing.  Cardiac exam with regular rate and rhythm.  Lungs clear to auscultation bilaterally.  Abdomen is soft, nontender, nondistended.  No lower extremity swelling.  No calf tenderness.  WBC 8.1, no leukocytosis.  Hemoglobin 16.1  Chemistry within normal limits.  Creatinine 1.04  Troponin negative  Given tick exposure, Lyme antibody ordered  EKG with normal sinus rhythm.  No acute ST elevation or changes that would be concerning for acute ischemia.  Intervals within normal limits.  Chest x-ray without evidence of focal consolidation, pneumothorax or pleural effusion.  No evidence of cardiomegaly or widened mediastinum.  Patient given GI cocktail and IV Pepcid  Bedside echo completed.  No evidence of pleural effusion.  Normal cardiac squeeze.  Negative study.  On reassessment, improvement in symptoms.  Patient appropriate for discharge at this time.  Low cardiac risk, minimal risk factors.  Delta troponin not completed.  Heart score  2  Patient advised to follow up with PCP.  Given a prescription for Protonix given that symptoms improved with GI cocktail and Pepcid, history consistent with acid reflux.  Recommended that patient talked to PCP about stress  testing, especially if symptoms persist.  Strict return precautions given.  All questions and concerns addressed prior to discharge.  Patient discharged in stable condition.              Medications Administered in the ED   GI cocktail (ANTACID SUSP, LIDOCAINE) oral solution (15 mL Oral Given 02/08/23 0056)   famotidine (PEPCID) 10 mg/mL injection (20 mg Intravenous Given 02/08/23 0056)     Clinical Impression   Chest pain, unspecified type (Primary)       Disposition: Discharged         *Parts of this patients chart were completed in a retrospective fashion due to simultaneous direct patient care activities in the Emergency Department.   *This note was partially generated using MModal Fluency Direct system, and there may be some incorrect words, spellings, and punctuation that were not noted in checking the note before saving.      / Osvaldo Angst MD/  PGY-2 Emergency Medicine  [facility]

## 2023-08-06 ENCOUNTER — Ambulatory Visit (INDEPENDENT_AMBULATORY_CARE_PROVIDER_SITE_OTHER): Payer: 59 | Admitting: PHYSICIAN ASSISTANT

## 2023-08-06 ENCOUNTER — Other Ambulatory Visit (INDEPENDENT_AMBULATORY_CARE_PROVIDER_SITE_OTHER): Payer: 59 | Admitting: Radiology

## 2023-08-06 ENCOUNTER — Encounter (INDEPENDENT_AMBULATORY_CARE_PROVIDER_SITE_OTHER): Payer: Self-pay

## 2023-08-06 ENCOUNTER — Other Ambulatory Visit: Payer: Self-pay

## 2023-08-06 VITALS — BP 135/90 | HR 94 | Temp 97.4°F | Resp 18 | Wt 239.4 lb

## 2023-08-06 DIAGNOSIS — M47817 Spondylosis without myelopathy or radiculopathy, lumbosacral region: Secondary | ICD-10-CM

## 2023-08-06 DIAGNOSIS — M545 Low back pain, unspecified: Secondary | ICD-10-CM

## 2023-08-06 MED ORDER — METHYLPREDNISOLONE ACETATE 80 MG/ML SUSPENSION FOR INJECTION
80.0000 mg | INTRAMUSCULAR | Status: AC
Start: 2023-08-06 — End: 2023-08-06
  Administered 2023-08-06: 80 mg via INTRAMUSCULAR

## 2023-08-06 MED ORDER — MELOXICAM 7.5 MG TABLET
7.5000 mg | ORAL_TABLET | Freq: Every day | ORAL | 0 refills | Status: AC
Start: 2023-08-06 — End: 2023-09-05

## 2023-08-06 MED ORDER — CYCLOBENZAPRINE 10 MG TABLET
10.0000 mg | ORAL_TABLET | Freq: Every evening | ORAL | 0 refills | Status: AC
Start: 2023-08-06 — End: 2023-11-04

## 2023-08-06 NOTE — Nursing Note (Signed)
Patient has given verbal permission for the scribe to assist the healthcare provider during the patients visit.

## 2023-08-06 NOTE — Progress Notes (Signed)
Attending physician: Dr. Royston Cowper  History of Present Illness: Jorge Maddox is a 38 y.o. male who presents to the Urgent Care today with chief complaint of    Chief Complaint              Back Pain Moving a piano on 07/11/23 and injured lower back, continues to have pain and difficulty in lower back           Pt presents to [facility] with c/o middle lower back pain onset 12/31. Patient reports he was moving a piano with 3 other people when they dropped the piano and he was the only one holding it. He states he felt a pull in the lower back and was bound to the couch for 4 days due to pain. He reports after gentle stretching, Tylenol arthritis, NSAID's and heating pad he began experiencing relief. He states 2 days ago the pain returned and this morning was unable to get out of bed due to the pain. Patient denies bowel and bladder incontinence, numbness and tingling in LE.    I reviewed and confirmed the patient's past medical history taken by the nurse or medical assistant with the addition of the following:    Past Medical History:    Past Medical History:   Diagnosis Date    Migraine Headaches 08/19/1997    Seizures      Past Surgical History:    History reviewed. No pertinent surgical history.    Allergies:  Allergies   Allergen Reactions    Horseradish Anaphylaxis    Biaxin [Clarithromycin] Nausea/ Vomiting    Iv Contrast     Oxycodone Mental Status Effect    Peanuts [Peanut] Itching     Medications:    Current Outpatient Medications   Medication Sig    albuterol sulfate (PROAIR HFA) 90 mcg/actuation Inhalation oral inhaler Take 2 Puffs by inhalation Every 4 hours as needed (Patient not taking: Reported on 03/19/2022)    cyclobenzaprine (FLEXERIL) 10 mg Oral Tablet Take 1 Tablet (10 mg total) by mouth Every night for 90 days    levETIRAcetam (KEPPRA) 1,000 mg Oral Tablet Take 1,250 mg by mouth Twice daily    meloxicam (MOBIC) 7.5 mg Oral Tablet Take 1 Tablet (7.5 mg total) by mouth Once a day  for 30 days     Social History:    Social History     Tobacco Use    Smoking status: Never    Smokeless tobacco: Never   Substance Use Topics    Alcohol use: Yes     Alcohol/week: 1.7 standard drinks of alcohol     Types: 2 Cans of beer per week     Comment: occ    Drug use: No     Family History:  Family Medical History:    None       Review of Systems:    Genitourinary: no bladder incontinence, no bowel incontinence   Musculoskeletal: +mid lower back pain  Neurologic: no numbness, no tingling   All other review of systems were negative    Physical Exam:  Vital signs:   Vitals:    08/06/23 1908   BP: (!) 135/90   Pulse: 94   Resp: 18   Temp: 36.3 C (97.4 F)   TempSrc: Thermal Scan   SpO2: 97%   Weight: 109 kg (239 lb 6.4 oz)     Body mass index is 32.47 kg/m. Facility age limit for growth %iles is 20  years.  No LMP for male patient.    General:  Well appearing and No acute distress  Eyes:  Normal lids/lashes, normal conjunctiva  Pulmonary:  clear to auscultation bilaterally, no wheezes, no rales and no rhonchi  Cardiovascular:  regular rate/rhythm, normal S1/S2, no murmur/rub/gallop  Musculoskeletal:  TTP from L3 to S1 of spine and musculature just lateral to spine to bilateral sides, only able to flex 20% but ROM intact with extension, side bending and twisting  Skin:  warm/dry and no rash  Psychiatric:  Appropriate affect and behavior and Normal speech  Neurologic:   Neurologically intact, Alert and oriented x 3    Data Reviewed:    Radiography:  Lumbar spine series  Reviewed by Joellyn Rued on 08/06/2023 at 19:43. No acute osseous abnormality.     Course: Condition at discharge: Good     Differential Diagnosis: Muscle Sprain vs Muscle Spasm vs Osseous Abnormality     Assessment:   Assessment/Plan   1. Acute low back pain without sciatica, unspecified back pain laterality      Plan:    Orders Placed This Encounter    Lumbar spine series    cyclobenzaprine (FLEXERIL) 10 mg Oral Tablet    methylPREDNISolone  acetate (DEPO-medrol) 80 mg/mL injection    meloxicam (MOBIC) 7.5 mg Oral Tablet     -Prescription(s) given for Flexeril and Mobic, given directions on proper use.  -Discussed increased fatigue with flexeril and advised taking it before sleeping & discussed no other NSAID'S with Mobic, discussed increased risk of GI bleed - advised always taking with food or taking with OTC omeprazole.   -Administered Depo-Medrol in clinic, patient tolerated well.   -Lumbar spine series was performed in clinic, see data reviewed.  -Symptomatic treatment recommended.   -Advised patient to follow up with PCP in 4-6 weeks if pain persists.     Go to Emergency Department immediately for further work up if any concerning symptoms.  Plan was discussed and patient verbalized understanding. If symptoms are worsening or not improving the patient should return to the [facility] for further evaluation.    I am scribing for, and in the presence of, [employer], [place], for services provided on 08/06/2023.  Dorma Russell, SCRIBE 08/06/2023, 19:13    I personally performed the services described in this documentation, as scribed in my presence, and it is both accurate and complete.  The supervising/collaborating physician on site for this visit was Dr. Royston Cowper.  [address], [place], PA-C 08/06/2023 20:03

## 2023-08-06 NOTE — Nursing Note (Signed)
Administrations This Visit       methylPREDNISolone acetate (DEPO-medrol) 80 mg/mL injection       Admin Date  08/06/2023  19:43 Dose  80 mg Route  IntraMUSCULAR Site  Right Buttock Documented By  Gladis Riffle, RN                  Patient with orders for DepoMedrol to be administered in urgent care today per order Joellyn Rued PA. DepoMedrol administered as ordered. Patient tolerated well. No bleeding noted at site upon administration. Band-Aid placed over site. Patient remains in urgent care for 15 minutes post administration.

## 2024-08-08 ENCOUNTER — Other Ambulatory Visit (INDEPENDENT_AMBULATORY_CARE_PROVIDER_SITE_OTHER): Payer: Self-pay | Admitting: Family Medicine

## 2024-08-08 ENCOUNTER — Ambulatory Visit (INDEPENDENT_AMBULATORY_CARE_PROVIDER_SITE_OTHER): Payer: Self-pay | Admitting: Family Medicine

## 2024-08-08 ENCOUNTER — Ambulatory Visit
Admission: RE | Admit: 2024-08-08 | Discharge: 2024-08-08 | Disposition: A | Discharge status: Home / Self Care | Payer: Self-pay | Source: Ambulatory Visit | Attending: Family Medicine | Admitting: Family Medicine

## 2024-08-08 DIAGNOSIS — Z021 Encounter for pre-employment examination: Secondary | ICD-10-CM
